# Patient Record
Sex: Male | Born: 1975 | Race: White | Hispanic: No | Marital: Married
Health system: Midwestern US, Community
[De-identification: ages and names within clinical notes are randomized; demographics above are authoritative.]

---

## 2000-04-30 HISTORY — PX: LUMBAR DISC SURGERY: SHX700

## 2001-04-30 HISTORY — PX: UVULOPALATOPHARYNGOPLASTY: SHX827

## 2008-11-24 ENCOUNTER — Ambulatory Visit: Payer: Self-pay | Admitting: Family Medicine

## 2008-11-24 ENCOUNTER — Telehealth (INDEPENDENT_AMBULATORY_CARE_PROVIDER_SITE_OTHER): Payer: Self-pay | Admitting: *Deleted

## 2008-11-24 DIAGNOSIS — A630 Anogenital (venereal) warts: Secondary | ICD-10-CM

## 2008-11-24 DIAGNOSIS — R51 Headache: Secondary | ICD-10-CM | POA: Insufficient documentation

## 2008-11-24 DIAGNOSIS — E1165 Type 2 diabetes mellitus with hyperglycemia: Secondary | ICD-10-CM

## 2008-11-24 DIAGNOSIS — R519 Headache, unspecified: Secondary | ICD-10-CM | POA: Insufficient documentation

## 2008-11-24 DIAGNOSIS — R5383 Other fatigue: Secondary | ICD-10-CM

## 2008-11-24 DIAGNOSIS — F329 Major depressive disorder, single episode, unspecified: Secondary | ICD-10-CM

## 2008-11-24 DIAGNOSIS — R5381 Other malaise: Secondary | ICD-10-CM | POA: Insufficient documentation

## 2008-11-25 LAB — CONVERTED CEMR LAB
ALT: 45 units/L (ref 0–53)
Basophils Absolute: 0 10*3/uL (ref 0.0–0.1)
Bilirubin, Direct: 0 mg/dL (ref 0.0–0.3)
Chloride: 107 meq/L (ref 96–112)
Eosinophils Relative: 4.6 % (ref 0.0–5.0)
GFR calc non Af Amer: 102.07 mL/min (ref >60)
Glucose, Bld: 131 mg/dL — ABNORMAL HIGH (ref 70–99)
HCT: 45.5 % (ref 39.0–52.0)
HDL: 35 mg/dL — ABNORMAL LOW (ref >39.00)
Hemoglobin: 15.3 g/dL (ref 13.0–17.0)
LDL Cholesterol: 70 mg/dL (ref 0–99)
Lymphs Abs: 2.2 10*3/uL (ref 0.7–4.0)
MCV: 87.1 fL (ref 78.0–100.0)
Monocytes Absolute: 0.4 10*3/uL (ref 0.1–1.0)
Monocytes Relative: 6 % (ref 3.0–12.0)
Neutro Abs: 3.9 10*3/uL (ref 1.4–7.7)
Potassium: 4.2 meq/L (ref 3.5–5.1)
RDW: 12.6 % (ref 11.5–14.6)
Sodium: 141 meq/L (ref 135–145)
TSH: 1.49 microintl units/mL (ref 0.35–5.50)
Total Bilirubin: 1.1 mg/dL (ref 0.3–1.2)
Total CHOL/HDL Ratio: 4
VLDL: 17.8 mg/dL (ref 0.0–40.0)

## 2008-11-26 ENCOUNTER — Ambulatory Visit: Payer: Self-pay | Admitting: Family Medicine

## 2010-05-05 ENCOUNTER — Encounter: Payer: Self-pay | Admitting: Family Medicine

## 2010-06-01 NOTE — Letter (Signed)
Summary: [facility]  [facility]   Imported By: Lennie Odor 05/16/2010 12:17:08  _____________________________________________________________________  External Attachment:    Type:   Image     Comment:   External Document

## 2010-10-13 ENCOUNTER — Ambulatory Visit (INDEPENDENT_AMBULATORY_CARE_PROVIDER_SITE_OTHER): Payer: 59 | Admitting: Family Medicine

## 2010-10-13 ENCOUNTER — Encounter: Payer: Self-pay | Admitting: Family Medicine

## 2010-10-13 DIAGNOSIS — Z0000 Encounter for general adult medical examination without abnormal findings: Secondary | ICD-10-CM

## 2010-10-13 DIAGNOSIS — E119 Type 2 diabetes mellitus without complications: Secondary | ICD-10-CM

## 2010-10-13 DIAGNOSIS — I878 Other specified disorders of veins: Secondary | ICD-10-CM

## 2010-10-13 DIAGNOSIS — I872 Venous insufficiency (chronic) (peripheral): Secondary | ICD-10-CM

## 2010-10-13 NOTE — Progress Notes (Signed)
  Subjective:    Patient ID: Maxwell Roberts, male    DOB: February 26, 1976, 35 y.o.   MRN: [id number]  HPI Patient seen for followup of chief complaint extremity swelling. History of morbid obesity. Type 2 diabetes poor. Poor followup. Previously on metformin. Had some fatigue and other atypical side effects. No GI side effects. Currently taking nothing for diabetes. Not monitoring blood sugars. No symptoms of hyperglycemia.  Patient expresses interest in possible gastric bypass  Edema relatively chronic lower legs but recently had some redness medial aspect of both legs. No itching. No pain. Denies fever or chills. No ulcerations. Progressive hair loss lower extremities over a few years. No claudication symptoms.  Generalized fatigue. No depressive symptoms. No physical in years   Review of Systems  Constitutional: Positive for fatigue.  Eyes: Negative for visual disturbance.  Respiratory: Negative for cough, shortness of breath and wheezing.   Cardiovascular: Positive for leg swelling. Negative for chest pain and palpitations.  Genitourinary: Negative for dysuria.  Neurological: Negative for dizziness and weakness.  Hematological: Negative for adenopathy.       Objective:   Physical Exam  Constitutional: He is oriented to person, place, and time.       Alert pleasant obese gentleman in no distress  HENT:  Head: Normocephalic and atraumatic.  Mouth/Throat: Oropharynx is clear and moist.  Neck: Neck supple. No thyromegaly present.  Cardiovascular: Normal rate and regular rhythm.   Pulmonary/Chest: Effort normal and breath sounds normal. No respiratory distress. He has no wheezes. He has no rales.  Musculoskeletal: He exhibits no tenderness.       Patient has some nonpitting edema lower legs bilaterally. No ulceration. Few prominent varicosities. Pigmentary change consistent with venous stasis  Lymphadenopathy:    He has no cervical adenopathy.  Neurological: He is alert and oriented to  person, place, and time.  Psychiatric: He has a normal mood and affect.          Assessment & Plan:  #1 morbid obesity. Patient inquiring about possible gastric bypass. Discussed that complete physical #2 type 2 diabetes. Poor history of compliance. Recheck A1c at physical within the next month. Consider getting back on metformin if tolerated. Discussed other options such as Victoza.  New glucose meter given and bring blood sugars at followup #3 venous stasis edema. Work on weight loss. Discussed compression hose if symptoms persist or worsen  #4  increased fatigue. Check labs with physical including testosterone level. Consider sleep study

## 2010-10-25 ENCOUNTER — Other Ambulatory Visit (INDEPENDENT_AMBULATORY_CARE_PROVIDER_SITE_OTHER): Payer: 59

## 2010-10-25 DIAGNOSIS — Z0000 Encounter for general adult medical examination without abnormal findings: Secondary | ICD-10-CM

## 2010-10-25 LAB — BASIC METABOLIC PANEL
Calcium: 8.9 mg/dL (ref 8.4–10.5)
GFR: 112.42 mL/min (ref >60.00)
Sodium: 140 mEq/L (ref 135–145)

## 2010-10-25 LAB — POCT URINALYSIS DIPSTICK
Ketones, UA: NEGATIVE
Spec Grav, UA: 1.025
Urobilinogen, UA: 0.2
pH, UA: 5.5

## 2010-10-25 LAB — LIPID PANEL
Cholesterol: 124 mg/dL (ref 0–200)
HDL: 36.1 mg/dL — ABNORMAL LOW (ref >39.00)
Triglycerides: 80 mg/dL (ref 0.0–149.0)

## 2010-10-25 LAB — HEPATIC FUNCTION PANEL
AST: 28 U/L (ref 0–37)
Albumin: 4 g/dL (ref 3.5–5.2)
Total Protein: 6.8 g/dL (ref 6.0–8.3)

## 2010-10-25 LAB — HEMOGLOBIN A1C: Hgb A1c MFr Bld: 9 % — ABNORMAL HIGH (ref 4.6–6.5)

## 2010-10-25 LAB — CBC WITH DIFFERENTIAL/PLATELET
Basophils Absolute: 0 10*3/uL (ref 0.0–0.1)
Eosinophils Relative: 0.8 % (ref 0.0–5.0)
HCT: 44.8 % (ref 39.0–52.0)
Hemoglobin: 15.3 g/dL (ref 13.0–17.0)
Lymphs Abs: 1.2 10*3/uL (ref 0.7–4.0)
MCV: 87.4 fl (ref 78.0–100.0)
Monocytes Absolute: 0.3 10*3/uL (ref 0.1–1.0)
Neutro Abs: 6.1 10*3/uL (ref 1.4–7.7)
Platelets: 237 10*3/uL (ref 150.0–400.0)
RDW: 13 % (ref 11.5–14.6)

## 2010-10-25 LAB — TSH: TSH: 1.51 u[IU]/mL (ref 0.35–5.50)

## 2010-10-30 ENCOUNTER — Other Ambulatory Visit: Payer: 59

## 2010-11-10 ENCOUNTER — Ambulatory Visit (INDEPENDENT_AMBULATORY_CARE_PROVIDER_SITE_OTHER): Payer: 59 | Admitting: Family Medicine

## 2010-11-10 ENCOUNTER — Encounter: Payer: Self-pay | Admitting: Family Medicine

## 2010-11-10 VITALS — BP 120/78 | HR 72 | Temp 98.7°F | Resp 12 | Ht 73.0 in | Wt 392.0 lb

## 2010-11-10 DIAGNOSIS — Z0000 Encounter for general adult medical examination without abnormal findings: Secondary | ICD-10-CM

## 2010-11-10 DIAGNOSIS — R5383 Other fatigue: Secondary | ICD-10-CM

## 2010-11-10 DIAGNOSIS — R5381 Other malaise: Secondary | ICD-10-CM

## 2010-11-10 DIAGNOSIS — E119 Type 2 diabetes mellitus without complications: Secondary | ICD-10-CM

## 2010-11-10 MED ORDER — METFORMIN HCL 500 MG PO TABS: 500.0000 mg | ORAL_TABLET | Freq: Two times a day (BID) | ORAL | Status: DC

## 2010-11-10 NOTE — Patient Instructions (Signed)
Follow up for testosterone level.

## 2010-11-10 NOTE — Progress Notes (Signed)
  Subjective:    Patient ID: Maxwell Roberts, male    DOB: [birth year], 35 y.o.   MRN: [id number]  HPI Patient here for complete physical examination. Has history of morbid obesity, type 2 diabetes currently not treated, and past history of depression. Tetanus given just yesterday.  Considering gastric bypass surgery. He's tried multiple weight loss programs without much success. He continues to have excessive fatigue as major complaint. He inquired about testosterone level but this was not drawn most recent labs. Past medical history, social history, and family history reviewed as recorded below  Past Medical History  Diagnosis Date  . Diabetes mellitus    Past Surgical History  Procedure Date  . Lumbar disc surgery 2002    L4-5  . Uvulopalatopharyngoplasty 2003    reports that he has never smoked. He has never used smokeless tobacco. He reports that he does not drink alcohol or use illicit drugs. family history includes Heart disease (age of onset:40) in his father. Allergies  Allergen Reactions  . Amoxicillin     REACTION: history childhood      Review of Systems  Constitutional: Positive for fatigue. Negative for fever, activity change, appetite change and unexpected weight change.  HENT: Negative for ear pain, congestion and trouble swallowing.   Eyes: Negative for pain and visual disturbance.  Respiratory: Negative for cough, shortness of breath and wheezing.   Cardiovascular: Negative for chest pain and palpitations.  Gastrointestinal: Negative for nausea, vomiting, abdominal pain, diarrhea, constipation, blood in stool, abdominal distention and rectal pain.  Genitourinary: Negative for dysuria, hematuria and testicular pain.  Musculoskeletal: Negative for joint swelling and arthralgias.  Skin: Negative for rash.  Neurological: Negative for dizziness, syncope and headaches.  Hematological: Negative for adenopathy.  Psychiatric/Behavioral: Negative for confusion and dysphoric  mood.       Objective:   Physical Exam  Constitutional: He is oriented to person, place, and time. He appears well-developed and well-nourished. No distress.  HENT:  Head: Normocephalic and atraumatic.  Right Ear: External ear normal.  Left Ear: External ear normal.  Mouth/Throat: Oropharynx is clear and moist.  Eyes: Conjunctivae and EOM are normal. Pupils are equal, round, and reactive to light.  Neck: Normal range of motion. Neck supple. No thyromegaly present.  Cardiovascular: Normal rate, regular rhythm and normal heart sounds.   No murmur heard. Pulmonary/Chest: No respiratory distress. He has no wheezes. He has no rales.  Abdominal: Soft. Bowel sounds are normal. He exhibits no distension and no mass. There is no tenderness. There is no rebound and no guarding.  Musculoskeletal: He exhibits no edema.  Lymphadenopathy:    He has no cervical adenopathy.  Neurological: He is alert and oriented to person, place, and time. He displays normal reflexes. No cranial nerve deficit.  Skin: No rash noted.  Psychiatric: He has a normal mood and affect. His behavior is normal.          Assessment & Plan:  Patient has morbid obesity and type 2 diabetes currently untreated. Recent A1c 9.0%. Continued fatigue which may be multifactorial. Add early-morning testosterone screen and he'll return for that. Start metformin 500 mg twice daily. Discussed weight loss and exercise. Routine followup 3 months.  He is considering looking into gastric bypass.

## 2011-02-06 ENCOUNTER — Other Ambulatory Visit: Payer: 59

## 2011-02-09 ENCOUNTER — Encounter: Payer: Self-pay | Admitting: Family Medicine

## 2011-02-09 ENCOUNTER — Ambulatory Visit (INDEPENDENT_AMBULATORY_CARE_PROVIDER_SITE_OTHER): Payer: 59 | Admitting: Family Medicine

## 2011-02-09 VITALS — BP 120/82 | Temp 97.8°F | Wt 383.0 lb

## 2011-02-09 DIAGNOSIS — E291 Testicular hypofunction: Secondary | ICD-10-CM

## 2011-02-09 DIAGNOSIS — E119 Type 2 diabetes mellitus without complications: Secondary | ICD-10-CM

## 2011-02-09 DIAGNOSIS — R5381 Other malaise: Secondary | ICD-10-CM

## 2011-02-09 DIAGNOSIS — I878 Other specified disorders of veins: Secondary | ICD-10-CM

## 2011-02-09 DIAGNOSIS — G4733 Obstructive sleep apnea (adult) (pediatric): Secondary | ICD-10-CM | POA: Insufficient documentation

## 2011-02-09 DIAGNOSIS — I872 Venous insufficiency (chronic) (peripheral): Secondary | ICD-10-CM

## 2011-02-09 DIAGNOSIS — R5383 Other fatigue: Secondary | ICD-10-CM

## 2011-02-09 MED ORDER — LANCET DEVICE MISC: Status: DC

## 2011-02-09 MED ORDER — METFORMIN HCL 500 MG PO TABS: 500.0000 mg | ORAL_TABLET | Freq: Two times a day (BID) | ORAL | Status: DC

## 2011-02-09 MED ORDER — GLUCOSE BLOOD VI STRP: ORAL_STRIP | Status: DC

## 2011-02-09 NOTE — Progress Notes (Signed)
  Subjective:    Patient ID: Maxwell Roberts, male    DOB: 1975-07-21, 34 y.o.   MRN: [id number]  HPI Medical followup. History of morbid obesity, type 2 diabetes, and fatigue. Recent A1c 9.0%. Has lost about 15 more pounds. Started metformin 500 mg twice daily. Blood sugar somewhat improved. Several fastings low 100s. Postprandials 150-160 range. Still has considerable fatigue. Remote history sleep apnea. Past surgical procedure several years ago (uvulopalatopharyngeoplasty). No obvious apnea this time.  Recent low total testosterone 178. He has decreased stamina. We discussed possible repeat level and he'll return next week for that along with repeat A1c. Currently takes only metformin. Compliant with therapy. No dyspnea. No chest pain.  Past Medical History  Diagnosis Date  . Diabetes mellitus    Past Surgical History  Procedure Date  . Lumbar disc surgery 2002    L4-5  . Uvulopalatopharyngoplasty 2003    reports that he has never smoked. He has never used smokeless tobacco. He reports that he does not drink alcohol or use illicit drugs. family history includes Heart disease (age of onset:40) in his father. Allergies  Allergen Reactions  . Amoxicillin     REACTION: history childhood      Review of Systems  Constitutional: Negative for fever, chills and unexpected weight change.  Respiratory: Negative for cough and shortness of breath.   Cardiovascular: Negative for chest pain.  Gastrointestinal: Negative for abdominal pain.  Genitourinary: Negative for dysuria.  Neurological: Negative for dizziness and headaches.       Objective:   Physical Exam  Constitutional: He is oriented to person, place, and time. He appears well-developed and well-nourished.  HENT:  Mouth/Throat: Oropharynx is clear and moist.  Neck: Neck supple.  Cardiovascular: Normal rate, regular rhythm and normal heart sounds.   No murmur heard. Pulmonary/Chest: Effort normal and breath sounds normal. No  respiratory distress. He has no wheezes. He has no rales.  Musculoskeletal:       Trace edema legs bilaterally. Hyperpigmentation changes lower legs consistent with venous stasis  Neurological: He is alert and oriented to person, place, and time.  Psychiatric: He has a normal mood and affect. His behavior is normal.          Assessment & Plan:  #1 type 2 diabetes. Poor control recently. Improving by home readings. Reassess A1c at followup lab next week. Continue weight loss efforts. #2 hypogonadism by recent screening testosterone. Return next week for repeat total and free testosterone level. We discussed various modes of replacement  #3 increased fatigue probably secondary to #2. #4 history of obstructive sleep apnea. No recent obvious apnea episodes. No significant daytime somnolence.

## 2011-02-13 ENCOUNTER — Other Ambulatory Visit (INDEPENDENT_AMBULATORY_CARE_PROVIDER_SITE_OTHER): Payer: 59

## 2011-02-13 DIAGNOSIS — E291 Testicular hypofunction: Secondary | ICD-10-CM

## 2011-02-13 DIAGNOSIS — R5383 Other fatigue: Secondary | ICD-10-CM

## 2011-02-13 LAB — HEMOGLOBIN A1C: Hgb A1c MFr Bld: 8.2 % — ABNORMAL HIGH (ref 4.6–6.5)

## 2011-02-14 LAB — TESTOSTERONE, FREE, TOTAL, SHBG
Sex Hormone Binding: 38 nmol/L (ref 13–71)
Testosterone, Free: 52.3 pg/mL (ref 47.0–244.0)
Testosterone-% Free: 1.8 % (ref 1.6–2.9)
Testosterone: 291.16 ng/dL (ref 250–890)

## 2011-02-15 ENCOUNTER — Other Ambulatory Visit: Payer: Self-pay | Admitting: Family Medicine

## 2011-02-15 MED ORDER — METFORMIN HCL 500 MG PO TABS: ORAL_TABLET | ORAL | Status: DC

## 2011-02-15 NOTE — Progress Notes (Signed)
Quick Note:  Pt informed on home VM ______ 

## 2011-05-11 ENCOUNTER — Ambulatory Visit: Payer: 59 | Admitting: Family Medicine

## 2011-05-17 ENCOUNTER — Ambulatory Visit (INDEPENDENT_AMBULATORY_CARE_PROVIDER_SITE_OTHER): Payer: 59 | Admitting: Family Medicine

## 2011-05-17 ENCOUNTER — Encounter: Payer: Self-pay | Admitting: Family Medicine

## 2011-05-17 VITALS — BP 120/84 | Temp 98.3°F | Wt 385.0 lb

## 2011-05-18 LAB — HEMOGLOBIN A1C: Hgb A1c MFr Bld: 9 % — ABNORMAL HIGH (ref 4.6–6.5)

## 2011-05-18 NOTE — Progress Notes (Signed)
  Subjective:    Patient ID: Maxwell Roberts, male    DOB: 1975/09/13, 36 y.o.   MRN: [id number]  HPI   Patient seen for followup type 2 diabetes. Last visit his A1c was up slightly and we titrated his metformin. Fasting blood sugars mostly around 130 to 140. No symptoms of hyperglycemia. Unfortunately, he has gained a few pounds over the holidays. He had orthopedic problem which has limited exercise. Poor compliance with diet at times. Compliant with all medications.   Review of Systems  Constitutional: Negative for fatigue.  Eyes: Negative for visual disturbance.  Respiratory: Negative for cough, chest tightness and shortness of breath.   Cardiovascular: Negative for chest pain, palpitations and leg swelling.  Neurological: Negative for dizziness, syncope, weakness, light-headedness and headaches.       Objective:   Physical Exam  Constitutional: He appears well-developed and well-nourished.  Cardiovascular: Normal rate and regular rhythm.   Pulmonary/Chest: Effort normal and breath sounds normal. No respiratory distress. He has no wheezes. He has no rales.  Musculoskeletal:       No pitting edema. No foot lesions. He has hyperpigmentation changes consistent with venous stasis. Normal sensory function to touch. No foot calluses or lesions  Neurological: He is alert.          Assessment & Plan:  Type 2 diabetes. History of suboptimal control. Recheck A1c. Continued work on weight loss. Patient considering gastric bypass. He plans to attend informational session on this next month

## 2011-05-22 ENCOUNTER — Other Ambulatory Visit: Payer: Self-pay | Admitting: Family Medicine

## 2011-05-22 NOTE — Progress Notes (Signed)
Quick Note:  Pt informed and voiced his understanding ______ 

## 2011-06-15 ENCOUNTER — Ambulatory Visit (INDEPENDENT_AMBULATORY_CARE_PROVIDER_SITE_OTHER): Payer: 59 | Admitting: Family Medicine

## 2011-06-15 ENCOUNTER — Encounter: Payer: Self-pay | Admitting: Family Medicine

## 2011-06-15 VITALS — BP 110/78 | Temp 98.5°F | Wt 390.0 lb

## 2011-06-15 DIAGNOSIS — B9789 Other viral agents as the cause of diseases classified elsewhere: Secondary | ICD-10-CM

## 2011-06-15 DIAGNOSIS — B349 Viral infection, unspecified: Secondary | ICD-10-CM

## 2011-06-15 MED ORDER — PREDNISONE 10 MG PO TABS: ORAL_TABLET | ORAL | Status: DC

## 2011-06-15 MED ORDER — ALBUTEROL SULFATE HFA 108 (90 BASE) MCG/ACT IN AERS: 2.0000 | INHALATION_SPRAY | Freq: Four times a day (QID) | RESPIRATORY_TRACT | Status: AC | PRN

## 2011-06-15 NOTE — Progress Notes (Signed)
  Subjective:    Patient ID: Maxwell Roberts, male    DOB: [birth year], 36 y.o.   MRN: [id number]  HPI  Patient is in with cough and congestion past few days. Onset Tuesday. On Wednesday had low-grade fever. He had asthma as a child. He feels he is wheezing some off and on. Mild dyspnea with activity. No chest pain. Cough mostly nonproductive. Mild intermittent headache. Some postnasal drip symptoms. Patient denies any nausea, vomiting, or diarrhea. Minimal sore throat. No significant body aches. Using ibuprofen for fever with good relief. Blood sugars stable   Review of Systems  Constitutional: Positive for fever and fatigue. Negative for appetite change.  HENT: Positive for sore throat and postnasal drip.   Respiratory: Positive for cough, shortness of breath and wheezing.   Cardiovascular: Negative for chest pain.       Objective:   Physical Exam  Constitutional: He appears well-developed and well-nourished.  HENT:  Right Ear: External ear normal.  Left Ear: External ear normal.  Mouth/Throat: Oropharynx is clear and moist.  Neck: Neck supple.  Cardiovascular: Normal rate and regular rhythm.   Pulmonary/Chest: Effort normal and breath sounds normal. No respiratory distress. He has no wheezes. He has no rales.  Lymphadenopathy:    He has no cervical adenopathy.          Assessment & Plan:  Viral syndrome. Possible reactive airway component though no wheezing noted at this time. Refill albuterol inhaler for as needed use. Wrote for prednisone taper to start if persistent wheezing and monitor blood sugars closely.

## 2011-06-15 NOTE — Patient Instructions (Signed)
Follow up for any persistent fever or worsening symptoms.

## 2011-06-20 ENCOUNTER — Telehealth: Payer: Self-pay | Admitting: Family Medicine

## 2011-06-20 MED ORDER — PREDNISONE 10 MG PO TABS: ORAL_TABLET | ORAL | Status: DC

## 2011-06-20 NOTE — Telephone Encounter (Signed)
Start back prednisone 20 mg 3-3-2-2-1-1 and follow up if no better after that.  Monitor blood sugars.

## 2011-06-20 NOTE — Telephone Encounter (Signed)
2/15 OV, prednisone taper and Albuterol.  Still really congested, coughing, sneezing, no fever, hard to breath.  Using inhaler, finished prednisone today.

## 2011-06-20 NOTE — Telephone Encounter (Signed)
Addended by: Melchor Amour on: 06/20/2011 02:20 PM   Modules accepted: Orders

## 2011-06-20 NOTE — Telephone Encounter (Signed)
Patient called stating that the rx prescribed at his last visit is not working. Please advise.

## 2011-12-30 ENCOUNTER — Emergency Department: Payer: Self-pay | Admitting: Internal Medicine

## 2011-12-30 LAB — CBC
HCT: 45.6 % (ref 40.0–52.0)
HGB: 15.4 g/dL (ref 13.0–18.0)
MCH: 28.9 pg (ref 26.0–34.0)
Platelet: 207 10*3/uL (ref 150–440)

## 2011-12-30 LAB — HEPATIC FUNCTION PANEL A (ARMC)
Albumin: 3.8 g/dL (ref 3.4–5.0)
Alkaline Phosphatase: 85 U/L (ref 50–136)
Bilirubin, Direct: 0.1 mg/dL (ref 0.00–0.20)
Bilirubin,Total: 0.5 mg/dL (ref 0.2–1.0)
Total Protein: 7.6 g/dL (ref 6.4–8.2)

## 2011-12-30 LAB — TROPONIN I: Troponin-I: <0.02 ng/mL

## 2011-12-30 LAB — BASIC METABOLIC PANEL
BUN: 12 mg/dL (ref 7–18)
Chloride: 101 mmol/L (ref 98–107)
Co2: 30 mmol/L (ref 21–32)
Creatinine: 0.96 mg/dL (ref 0.60–1.30)
EGFR (Non-African Amer.): >60
Potassium: 4 mmol/L (ref 3.5–5.1)

## 2011-12-30 LAB — CK TOTAL AND CKMB (NOT AT ARMC): CK-MB: 1.2 ng/mL (ref 0.5–3.6)

## 2011-12-30 LAB — LIPASE, BLOOD: Lipase: 78 U/L (ref 73–393)

## 2012-01-03 ENCOUNTER — Ambulatory Visit (INDEPENDENT_AMBULATORY_CARE_PROVIDER_SITE_OTHER): Payer: 59 | Admitting: Family Medicine

## 2012-01-03 ENCOUNTER — Encounter: Payer: Self-pay | Admitting: Family Medicine

## 2012-01-03 VITALS — BP 122/88 | Temp 97.8°F | Wt 376.0 lb

## 2012-01-03 DIAGNOSIS — IMO0001 Reserved for inherently not codable concepts without codable children: Secondary | ICD-10-CM

## 2012-01-03 DIAGNOSIS — R05 Cough: Secondary | ICD-10-CM

## 2012-01-03 DIAGNOSIS — R059 Cough, unspecified: Secondary | ICD-10-CM

## 2012-01-03 MED ORDER — GLIMEPIRIDE 4 MG PO TABS: 4.0000 mg | ORAL_TABLET | Freq: Every day | ORAL | Status: DC

## 2012-01-03 MED ORDER — HYDROCODONE-HOMATROPINE 5-1.5 MG/5ML PO SYRP: 5.0000 mL | ORAL_SOLUTION | Freq: Three times a day (TID) | ORAL | Status: AC | PRN

## 2012-01-03 NOTE — Progress Notes (Signed)
  Subjective:    Patient ID: Maxwell Roberts, male    DOB: Feb 09, 1976, 36 y.o.   MRN: [id number]  HPI  Patient seen for the following  Bronchitis symptoms. Onset about a week ago. Over the weekend went to [facility] with increased cough and fever 101. Reportedly had chest x-ray which apparently did not show any clear pneumonia the patient was started on Levaquin 750 mg daily. He has chest wall pain treated with ketorolac 10 mg which has been taking about twice daily and is helping musculoskeletal symptoms. Fevers resolved at this time. Questionable wheezing off and on at night. No nausea or vomiting. No pleuritic pain. Cough is mostly nonproductive. Worse at night. Interfering with sleep.  Type 2 diabetes. History of poor control. Last A1c 9.0%. Taking metformin thousand milligrams twice a day. Recent blood sugars 200-300 range.  Past Medical History  Diagnosis Date  . Diabetes mellitus    Past Surgical History  Procedure Date  . Lumbar disc surgery 2002    L4-5  . Uvulopalatopharyngoplasty 2003    reports that he has never smoked. He has never used smokeless tobacco. He reports that he does not drink alcohol or use illicit drugs. family history includes Heart disease (age of onset:40) in his father. Allergies  Allergen Reactions  . Amoxicillin     REACTION: history childhood      Review of Systems  Constitutional: Positive for fatigue. Negative for fever and chills.  Respiratory: Positive for cough and shortness of breath.   Cardiovascular: Negative for palpitations and leg swelling.  Gastrointestinal: Negative for abdominal pain.  Neurological: Negative for dizziness and syncope.  Hematological: Negative for adenopathy.       Objective:   Physical Exam  Constitutional: He appears well-developed and well-nourished.  HENT:  Right Ear: External ear normal.  Left Ear: External ear normal.  Mouth/Throat: Oropharynx is clear and moist.  Neck: Neck supple. No  thyromegaly present.  Cardiovascular: Normal rate and regular rhythm.   Pulmonary/Chest: Effort normal and breath sounds normal. No respiratory distress. He has no wheezes. He has no rales.  Musculoskeletal: He exhibits no edema.          Assessment & Plan:  #1 upper respiratory illness. Normal lung exam. Patient already on Levaquin. Hycodan cough syrup for nighttime use as needed. Followup promptly for any fever or worsening symptoms #2 type 2 diabetes with poor control. Add Amaryl 4 mg daily and continue metformin. Reassess 2 months and repeat A1c then

## 2012-01-03 NOTE — Patient Instructions (Signed)

## 2012-03-03 ENCOUNTER — Ambulatory Visit: Payer: 59 | Admitting: Family Medicine

## 2012-09-12 ENCOUNTER — Ambulatory Visit (INDEPENDENT_AMBULATORY_CARE_PROVIDER_SITE_OTHER): Payer: 59 | Admitting: Family Medicine

## 2012-09-12 ENCOUNTER — Encounter: Payer: Self-pay | Admitting: Family Medicine

## 2012-09-12 VITALS — BP 110/68 | Temp 97.8°F | Wt 373.0 lb

## 2012-09-12 DIAGNOSIS — N509 Disorder of male genital organs, unspecified: Secondary | ICD-10-CM

## 2012-09-12 DIAGNOSIS — N529 Male erectile dysfunction, unspecified: Secondary | ICD-10-CM

## 2012-09-12 DIAGNOSIS — N50811 Right testicular pain: Secondary | ICD-10-CM

## 2012-09-12 MED ORDER — LIRAGLUTIDE 18 MG/3ML ~~LOC~~ SOPN: 1.2000 [IU] | PEN_INJECTOR | Freq: Every day | SUBCUTANEOUS | Status: DC

## 2012-09-12 MED ORDER — SILDENAFIL CITRATE 100 MG PO TABS: 100.0000 mg | ORAL_TABLET | Freq: Every day | ORAL | Status: DC | PRN

## 2012-09-12 MED ORDER — METFORMIN HCL 500 MG PO TABS: 500.0000 mg | ORAL_TABLET | Freq: Two times a day (BID) | ORAL | Status: DC

## 2012-09-12 MED ORDER — INSULIN PEN NEEDLE 31G X 6 MM MISC: Status: DC

## 2012-09-12 NOTE — Progress Notes (Signed)
  Subjective:    Patient ID: Maxwell Roberts, male    DOB: June 19, 1975, 37 y.o.   MRN: [id number]  HPI Medical followup. Patient has several issues as follows History of recent poor compliance. Ran out of medication about 2 months ago. Fasting blood sugar this morning 148. Previously took metformin and Amaryl. Last A1c was over one year ago and 9.0%. No recent eye exam. No consistent exercise. Patient is very busy with work and school Usually only gets about 4-6 hours of sleep per night.  Recent problems with erectile dysfunction. No history of peripheral vascular disease. Good libido.  Right testicular pain for 3 months. No masses noted. No dysuria  Past Medical History  Diagnosis Date  . Diabetes mellitus    Past Surgical History  Procedure Laterality Date  . Lumbar disc surgery  2002    L4-5  . Uvulopalatopharyngoplasty  2003    reports that he has never smoked. He has never used smokeless tobacco. He reports that he does not drink alcohol or use illicit drugs. family history includes Heart disease (age of onset: 48) in his father. Allergies  Allergen Reactions  . Amoxicillin     REACTION: history childhood      Review of Systems  Constitutional: Negative for fatigue and unexpected weight change.  Eyes: Negative for visual disturbance.  Respiratory: Negative for cough, chest tightness and shortness of breath.   Cardiovascular: Negative for chest pain, palpitations and leg swelling.  Genitourinary: Positive for testicular pain. Negative for dysuria and hematuria.  Neurological: Negative for dizziness, syncope, weakness, light-headedness and headaches.  Hematological: Negative for adenopathy.       Objective:   Physical Exam  Constitutional: He appears well-developed and well-nourished.  Neck: Neck supple.  Cardiovascular: Normal rate and regular rhythm.  Exam reveals no gallop.   Pulmonary/Chest: Effort normal and breath sounds normal. No respiratory distress. He has no  wheezes. He has no rales.  Genitourinary:  He has some nonspecific generalized right testicle tenderness. No distinct mass noted. No obvious hernia  Musculoskeletal: He exhibits no edema.          Assessment & Plan:  #1 type 2 diabetes. History of poor control. History of poor compliance. We discussed starting back on metformin 500 mg twice a day. He had intolerance with diarrhea and nausea with higher doses. Discontinue Amaryl with patient struggling with weight loss. Start Victoza 0.6 mg once daily for one week then titrate to 1.2 mg daily.  Needs to set up eye exam. Check baseline A1c and urine microalbumin today #2 erectile dysfunction. Trial of Viagra 100 mg one half to one tablet daily as needed #3 right testicle pain. Set up ultrasound to further evaluate. No worrisome masses noted and no hernia.

## 2012-09-12 NOTE — Patient Instructions (Signed)
Victoza 0.6 mg once daily for one week then increase to 1.2 mg once daily Set up eye exam

## 2012-09-13 LAB — MICROALBUMIN / CREATININE URINE RATIO: Microalb Creat Ratio: 2.6 mg/g (ref 0.0–30.0)

## 2012-09-15 ENCOUNTER — Other Ambulatory Visit: Payer: Self-pay | Admitting: *Deleted

## 2012-09-15 DIAGNOSIS — N5082 Scrotal pain: Secondary | ICD-10-CM

## 2012-09-16 NOTE — Progress Notes (Signed)
Quick Note:  Pt informed on VM ______ 

## 2012-09-24 ENCOUNTER — Encounter: Payer: Self-pay | Admitting: Family Medicine

## 2012-09-24 ENCOUNTER — Ambulatory Visit (INDEPENDENT_AMBULATORY_CARE_PROVIDER_SITE_OTHER): Payer: 59 | Admitting: Family Medicine

## 2012-09-24 VITALS — BP 110/78 | Temp 99.4°F | Wt 373.0 lb

## 2012-09-24 DIAGNOSIS — L559 Sunburn, unspecified: Secondary | ICD-10-CM

## 2012-09-24 MED ORDER — HYDROCODONE-ACETAMINOPHEN 5-325 MG PO TABS: ORAL_TABLET | ORAL | Status: DC

## 2012-09-24 NOTE — Patient Instructions (Addendum)
Sunburn  Sunburn is damage to the skin caused by overexposure to ultraviolet (UV) rays. People with light skin or a fair complexion may be more susceptible to sunburn. Repeated sun exposure causes early skin aging such as wrinkles and sun spots. It also increases the risk of skin cancer.  CAUSES  A sunburn is caused by getting too much UV radiation from the sun.  SYMPTOMS   Red or pink skin.   Soreness and swelling.   Pain.   Blisters.   Peeling skin.   Headache, fever, and fatigue if sunburn covers a large area.  TREATMENT   Your caregiver may tell you to take certain medicines to lessen inflammation.   Your caregiver may have you use hydrocortisone cream or spray to help with itching and inflammation.   Your caregiver may prescribe an antibiotic cream to use on blisters.  HOME CARE INSTRUCTIONS    Avoid further exposure to the sun.   Cool baths and cool compresses may be helpful if used several times per day. Do not apply ice, since this may result in more damage to the skin.   Only take over-the-counter or prescription medicines for pain, discomfort, or fever as directed by your caregiver.   Use aloe or other over-the-counter sunburn creams or gels on your skin. Do not apply these creams or gels on blisters.   Drink enough fluids to keep your urine clear or pale yellow.   Do not break blisters. If blisters break, your caregiver may recommend an antibiotic cream to apply to the affected area.  PREVENTION    Try to avoid the sun between 10:00 a.m. and 4:00 p.m. when it is the strongest.   Use a sunscreen or sunblock with SPF 30 or greater.   Apply sunscreen at least 30 minutes before exposure to the sun.   Always wear protective hats, clothing, and sunglasses with UV protection.   Avoid medicines, herbs, and foods that increase your sensitivity to sunlight.   Avoid tanning beds.  SEEK IMMEDIATE MEDICAL CARE IF:    You have a fever.   Your pain is uncontrolled with medicine.   You start to  vomit or have diarrhea.   You feel faint or develop a headache with confusion.   You develop severe blistering.   You have a pus-like (purulent) discharge coming from the blisters.   Your burn becomes more painful and swollen.  MAKE SURE YOU:   Understand these instructions.   Will watch your condition.   Will get help right away if you are not doing well or get worse.  Document Released: 01/24/2005 Document Revised: 07/09/2011 Document Reviewed: 10/08/2010  [employer] Patient Information 2014 [employer]

## 2012-09-24 NOTE — Progress Notes (Signed)
  Subjective:    Patient ID: Maxwell Roberts, male    DOB: 24-Sep-1975, 37 y.o.   MRN: [id number]  HPI Acute visit. Patient seen with severe sunburn. This occurred over the weekend on Saturday. He initially had some possible low-grade fever, chills, lethargy and basically evidence for sun poisoning Overall improving but still has severe pain with sunburn. He's tried topical aloe and some basic over-the-counter analgesics without much improvement No further fever  Type 2 diabetes with improvement with addition recently of Victoza. Fasting blood sugars recently around 130 which is an improvement for him  Past Medical History  Diagnosis Date  . Diabetes mellitus    Past Surgical History  Procedure Laterality Date  . Lumbar disc surgery  2002    L4-5  . Uvulopalatopharyngoplasty  2003    reports that he has never smoked. He has never used smokeless tobacco. He reports that he does not drink alcohol or use illicit drugs. family history includes Heart disease (age of onset: 12) in his father. Allergies  Allergen Reactions  . Amoxicillin     REACTION: history childhood      Review of Systems  Constitutional: Negative for fever and chills.       Objective:   Physical Exam  Constitutional: He appears well-developed and well-nourished.  Cardiovascular: Normal rate and regular rhythm.   Pulmonary/Chest: Effort normal and breath sounds normal. No respiratory distress. He has no wheezes. He has no rales.  Skin:  Patient has fairly severe sunburn involving upper back, neck, shoulders, and upper extremities and upper anterior chest. He has some early blistering involving the shoulders and upper back region.          Assessment & Plan:  Severe sunburn. Avoid further sun exposure. Sun block in the future. Continue aloe. Limited hydrocodone 5/325 mg #30 tablets one to 2 every 6 hours for severe pain. He will use mostly at night for sleep issues. Keep clean with soap and water and avoid  secondary infection

## 2012-10-10 ENCOUNTER — Other Ambulatory Visit: Payer: 59

## 2012-10-10 ENCOUNTER — Ambulatory Visit: Payer: 59 | Admitting: Family Medicine

## 2012-10-13 ENCOUNTER — Encounter: Payer: Self-pay | Admitting: Family Medicine

## 2012-10-13 ENCOUNTER — Ambulatory Visit (INDEPENDENT_AMBULATORY_CARE_PROVIDER_SITE_OTHER): Payer: 59 | Admitting: Family Medicine

## 2012-10-13 ENCOUNTER — Ambulatory Visit
Admission: RE | Admit: 2012-10-13 | Discharge: 2012-10-13 | Disposition: A | Discharge status: Home / Self Care | Payer: 59 | Source: Ambulatory Visit | Attending: Family Medicine | Admitting: Family Medicine

## 2012-10-13 VITALS — BP 120/84 | Temp 99.4°F | Wt 379.0 lb

## 2012-10-13 DIAGNOSIS — N5082 Scrotal pain: Secondary | ICD-10-CM

## 2012-10-13 DIAGNOSIS — N50811 Right testicular pain: Secondary | ICD-10-CM

## 2012-10-13 NOTE — Progress Notes (Signed)
  Subjective:    Patient ID: Maxwell Roberts, male    DOB: 1975/12/27, 37 y.o.   MRN: [id number]  HPI Followup type 2 diabetes. Recent addition of Victoza 1.2 once daily and tolerating well Also remains on metformin 500 mg twice a day. Last A1c 9.3%. Symptomatically improved with addition of medication above. Denies any thirst. No significant urine frequency. No blurred vision. Plan to start more consistent exercise soon. Thus far, has not seen much weight loss  Past Medical History  Diagnosis Date  . Diabetes mellitus    Past Surgical History  Procedure Laterality Date  . Lumbar disc surgery  2002    L4-5  . Uvulopalatopharyngoplasty  2003    reports that he has never smoked. He has never used smokeless tobacco. He reports that he does not drink alcohol or use illicit drugs. family history includes Heart disease (age of onset: 9) in his father. Allergies  Allergen Reactions  . Amoxicillin     REACTION: history childhood      Review of Systems  Constitutional: Negative for fatigue.  Eyes: Negative for visual disturbance.  Respiratory: Negative for cough, chest tightness and shortness of breath.   Cardiovascular: Negative for chest pain, palpitations and leg swelling.  Endocrine: Negative for polydipsia, polyphagia and polyuria.  Neurological: Negative for dizziness, syncope, weakness, light-headedness and headaches.       Objective:   Physical Exam  Constitutional: He appears well-developed and well-nourished.  Cardiovascular: Normal rate and regular rhythm.   Pulmonary/Chest: Effort normal and breath sounds normal. No respiratory distress. He has no wheezes. He has no rales.  Musculoskeletal: He exhibits no edema.          Assessment & Plan:  Type 2 diabetes. Improved by home readings. Titrate metformin to 500 mg 2 tablets twice daily. Continue Victoza 1.2 mg once daily. Future A1c ordered for 2 months.  Continue weight loss efforts

## 2012-10-13 NOTE — Progress Notes (Signed)
Quick Note:  LMTCB for [place] report ______

## 2012-10-15 NOTE — Progress Notes (Signed)
Quick Note:  LMTCB ______ 

## 2012-10-15 NOTE — Progress Notes (Signed)
Quick Note:  Pt informed ______ 

## 2012-12-15 ENCOUNTER — Other Ambulatory Visit: Payer: 59

## 2012-12-16 ENCOUNTER — Other Ambulatory Visit (INDEPENDENT_AMBULATORY_CARE_PROVIDER_SITE_OTHER): Payer: 59

## 2012-12-16 DIAGNOSIS — IMO0001 Reserved for inherently not codable concepts without codable children: Secondary | ICD-10-CM

## 2012-12-22 ENCOUNTER — Telehealth: Payer: Self-pay

## 2012-12-22 ENCOUNTER — Ambulatory Visit: Payer: 59 | Admitting: Family Medicine

## 2012-12-22 NOTE — Telephone Encounter (Signed)
Left message on pt VM that he missed his appointment

## 2013-04-06 ENCOUNTER — Ambulatory Visit: Payer: 59 | Admitting: Family Medicine

## 2013-04-13 ENCOUNTER — Encounter: Payer: Self-pay | Admitting: Family Medicine

## 2013-04-13 ENCOUNTER — Ambulatory Visit (INDEPENDENT_AMBULATORY_CARE_PROVIDER_SITE_OTHER): Payer: 59 | Admitting: Family Medicine

## 2013-04-13 VITALS — BP 124/84 | HR 74 | Temp 97.2°F | Wt 383.6 lb

## 2013-04-13 DIAGNOSIS — Z23 Encounter for immunization: Secondary | ICD-10-CM

## 2013-04-13 LAB — HM DIABETES EYE EXAM

## 2013-04-13 NOTE — Progress Notes (Signed)
   Subjective:    Patient ID: Maxwell Roberts, male    DOB: 06-22-1975, 37 y.o.   MRN: [id number]  HPI Patient here for medical followup. He has type 2 diabetes and history of poor compliance with diet and exercise. His weight is unfortunately up from last visit. He remains on metformin total of 2000 mg daily and Victoza 1.2 mg once daily. He does not consistently take this medication. Not checking blood sugars regularly. Occasional fastings over 200. Last A1c 7.9%. He has not had lipid panel in 2 years.  He had some recent increased thirst but no polyuria. He has been finishing up college and has had increased stress related to that. Poor dietary compliance.  Past Medical History  Diagnosis Date  . Diabetes mellitus    Past Surgical History  Procedure Laterality Date  . Lumbar disc surgery  2002    L4-5  . Uvulopalatopharyngoplasty  2003    reports that he has never smoked. He has never used smokeless tobacco. He reports that he does not drink alcohol or use illicit drugs. family history includes Heart disease (age of onset: 73) in his father. Allergies  Allergen Reactions  . Amoxicillin     REACTION: history childhood      Review of Systems  Constitutional: Positive for fatigue. Negative for unexpected weight change.  Eyes: Negative for visual disturbance.  Respiratory: Negative for cough, chest tightness and shortness of breath.   Cardiovascular: Negative for chest pain, palpitations and leg swelling.  Endocrine: Positive for polydipsia. Negative for polyuria.  Neurological: Negative for dizziness, syncope, weakness, light-headedness and headaches.       Objective:   Physical Exam  Constitutional: He appears well-developed and well-nourished.  Neck: Neck supple. No thyromegaly present.  Cardiovascular: Normal rate and regular rhythm.   Pulmonary/Chest: Effort normal and breath sounds normal. No respiratory distress. He has no wheezes. He has no rales.  Musculoskeletal:  He exhibits no edema.          Assessment & Plan:  Type 2 diabetes. History of suboptimal control. Poor compliance. Repeat A1c and also added additional labs of lipid panel, basic metabolic panel, hepatic panel. He'll return fasting for these tomorrow. If blood sugar still elevated, consider SGPL 2 inhibitor.  Long discussion with patient regarding need for better dietary compliance and weight loss. He is encouraged to set up eye exam

## 2013-04-13 NOTE — Progress Notes (Signed)
Pre visit review using our clinic review tool, if applicable. No additional management support is needed unless otherwise documented below in the visit note. 

## 2013-04-14 ENCOUNTER — Other Ambulatory Visit (INDEPENDENT_AMBULATORY_CARE_PROVIDER_SITE_OTHER): Payer: 59

## 2013-04-14 LAB — HEMOGLOBIN A1C: Hgb A1c MFr Bld: 11.5 % — ABNORMAL HIGH (ref 4.6–6.5)

## 2013-04-14 LAB — BASIC METABOLIC PANEL
BUN: 13 mg/dL (ref 6–23)
Chloride: 100 mEq/L (ref 96–112)
Potassium: 4.3 mEq/L (ref 3.5–5.1)
Sodium: 134 mEq/L — ABNORMAL LOW (ref 135–145)

## 2013-04-14 LAB — LIPID PANEL
Cholesterol: 113 mg/dL (ref 0–200)
LDL Cholesterol: 56 mg/dL (ref 0–99)

## 2013-04-14 LAB — HEPATIC FUNCTION PANEL
AST: 30 U/L (ref 0–37)
Albumin: 4 g/dL (ref 3.5–5.2)
Alkaline Phosphatase: 68 U/L (ref 39–117)
Bilirubin, Direct: 0.2 mg/dL (ref 0.0–0.3)

## 2013-04-15 ENCOUNTER — Other Ambulatory Visit: Payer: Self-pay | Admitting: Family Medicine

## 2013-04-15 ENCOUNTER — Other Ambulatory Visit: Payer: Self-pay

## 2013-04-15 MED ORDER — CANAGLIFLOZIN 100 MG PO TABS: 100.0000 mg | ORAL_TABLET | Freq: Every day | ORAL | Status: DC

## 2013-07-13 ENCOUNTER — Ambulatory Visit: Payer: 59 | Admitting: Family Medicine

## 2013-07-19 IMAGING — CR DG CHEST 2V
1 series · 3 of 3 positions shown · non-contrast
Comparison: none

REASON FOR EXAM: cough, chest pain
COMMENTS:

PROCEDURE:     DXR - DXR CHEST PA (OR AP) AND LATERAL  - December 30, 2011 [DATE]
RESULT:     The lungs are clear. The cardiac silhouette and visualized bony
skeleton are unremarkable.

[Series 1: w chest pa · 0.14mm/px · 3 of 3 slices shown]
[im 1/3]
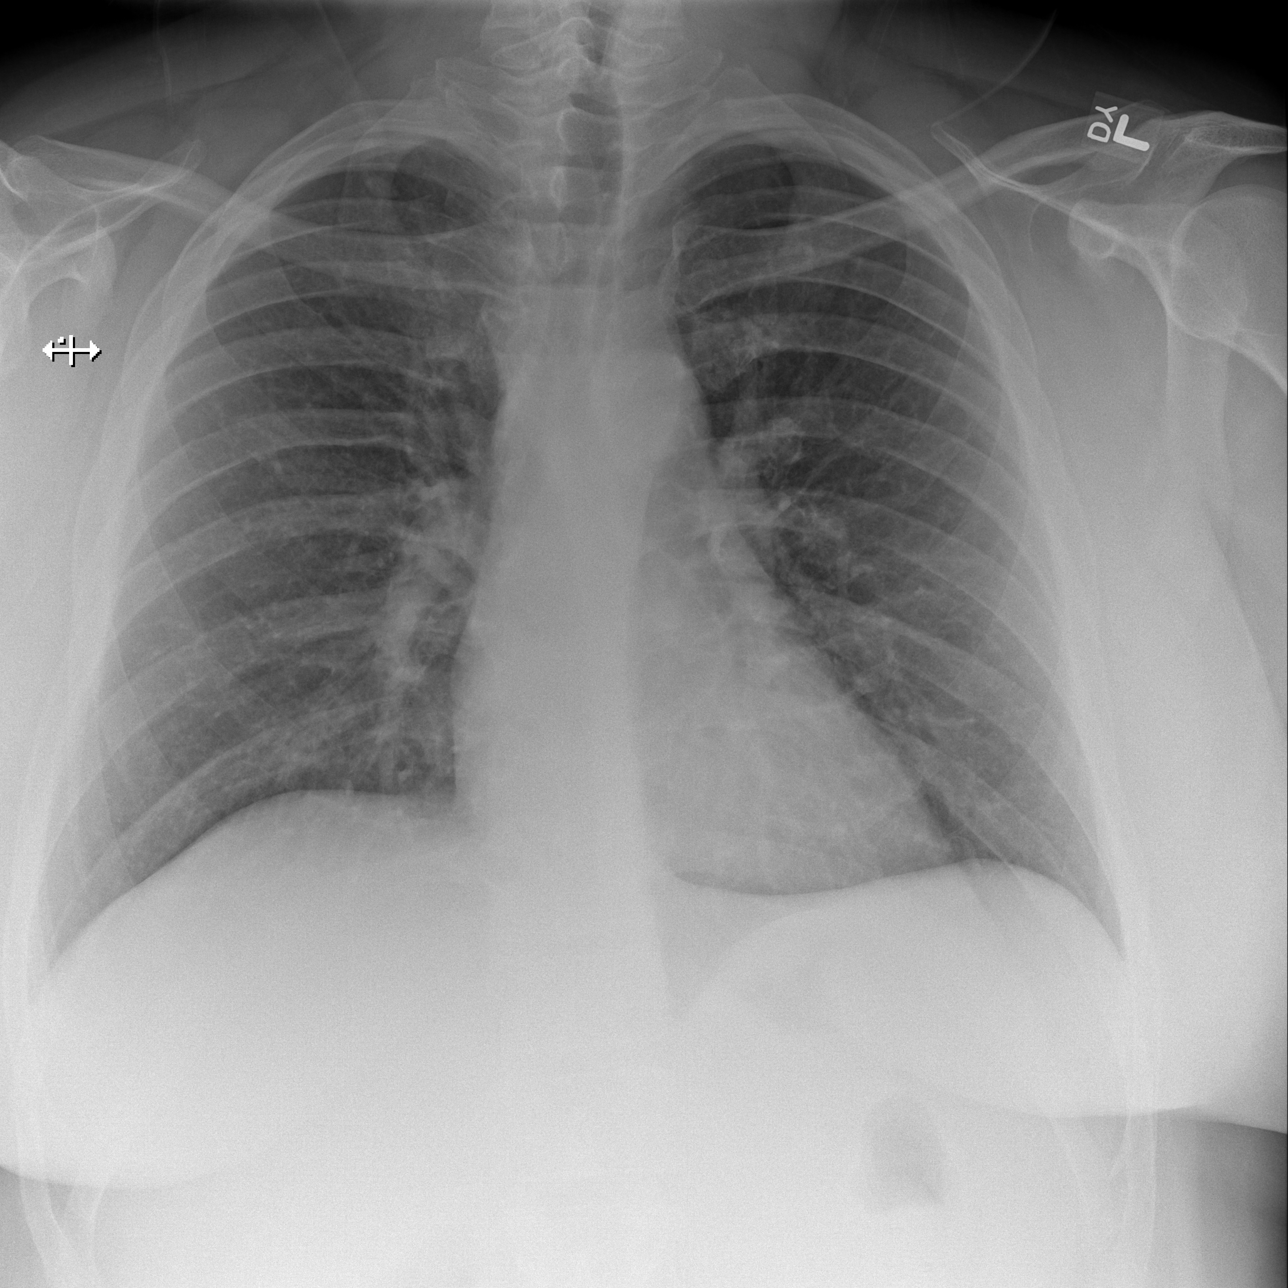
[im 2/3]
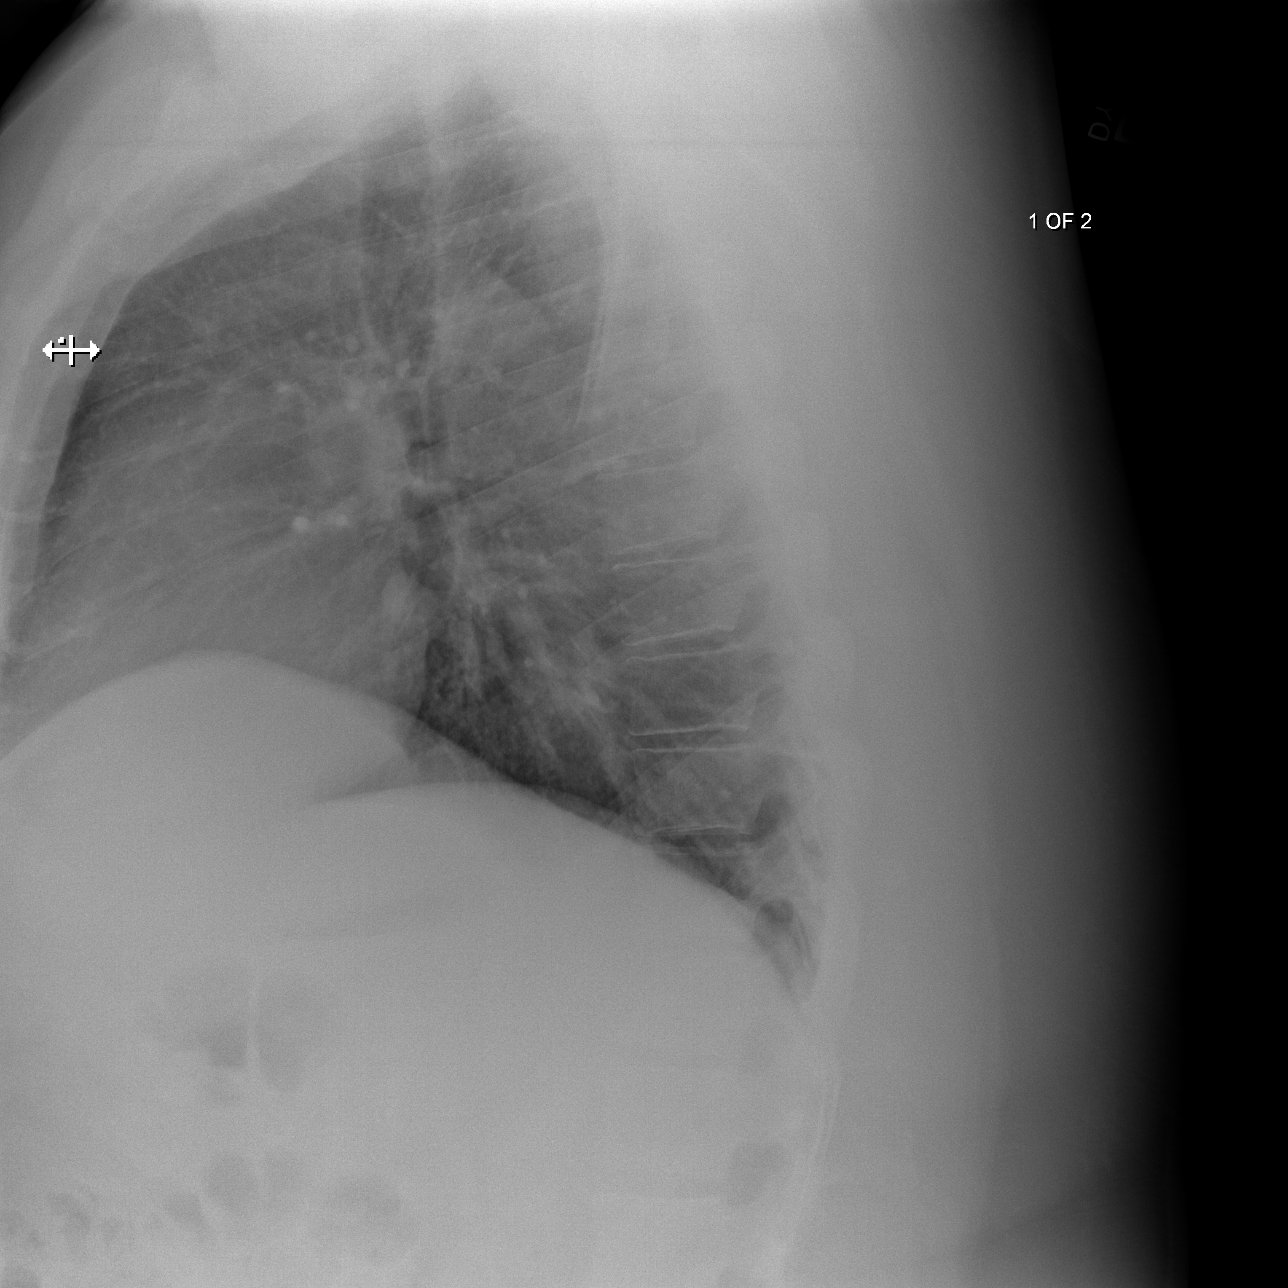
[im 3/3]
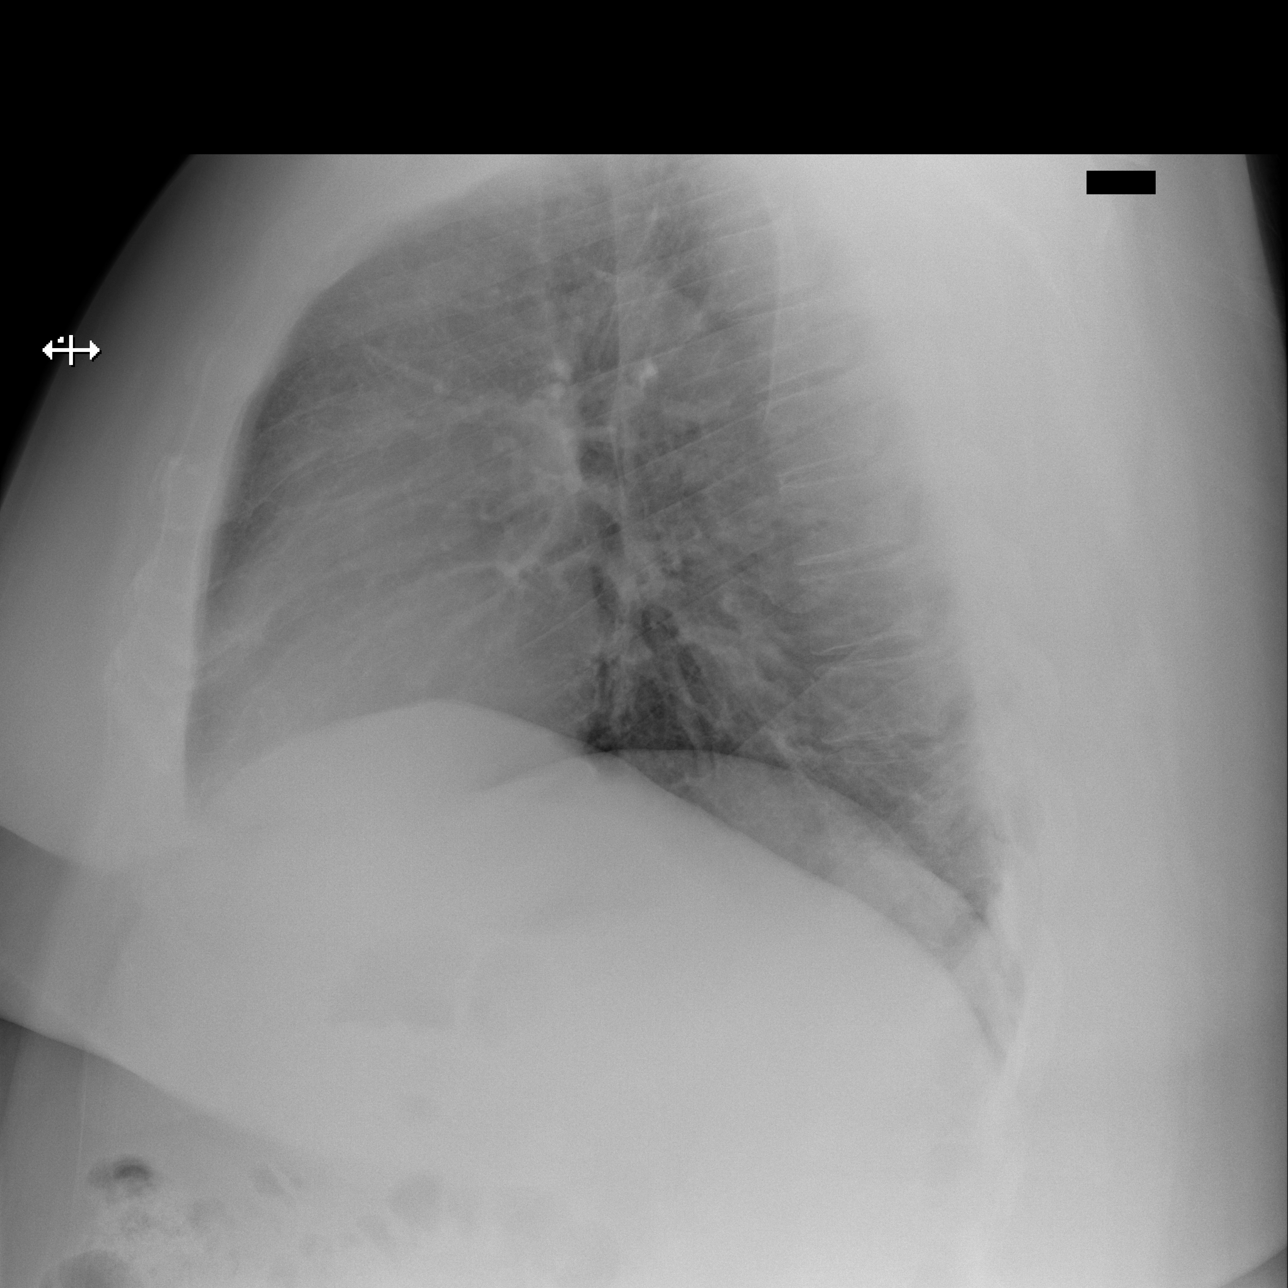

[3 of 3 positions shown; findings below may reference images not displayed]

IMPRESSION: 1. Chest radiograph without evidence of acute cardiopulmonary disease.

## 2013-10-05 ENCOUNTER — Other Ambulatory Visit: Payer: Self-pay | Admitting: Family Medicine

## 2013-11-18 ENCOUNTER — Telehealth: Payer: Self-pay | Admitting: *Deleted

## 2013-11-18 DIAGNOSIS — E109 Type 1 diabetes mellitus without complications: Secondary | ICD-10-CM

## 2013-11-18 DIAGNOSIS — E1165 Type 2 diabetes mellitus with hyperglycemia: Secondary | ICD-10-CM

## 2013-11-18 DIAGNOSIS — IMO0001 Reserved for inherently not codable concepts without codable children: Secondary | ICD-10-CM

## 2013-11-18 NOTE — Telephone Encounter (Signed)
Left message on machine for patient to schedule an office/lab appointment to follow up with diabetes A1c, bmet, micro albumin ordered Diabetic bundle

## 2014-01-13 ENCOUNTER — Encounter: Payer: Self-pay | Admitting: Family Medicine

## 2014-01-13 ENCOUNTER — Other Ambulatory Visit: Payer: Self-pay

## 2014-01-13 ENCOUNTER — Ambulatory Visit (INDEPENDENT_AMBULATORY_CARE_PROVIDER_SITE_OTHER): Payer: BC Managed Care – PPO | Admitting: Family Medicine

## 2014-01-13 VITALS — BP 124/80 | HR 93 | Temp 97.5°F | Wt 376.0 lb

## 2014-01-13 DIAGNOSIS — IMO0001 Reserved for inherently not codable concepts without codable children: Secondary | ICD-10-CM

## 2014-01-13 DIAGNOSIS — E1165 Type 2 diabetes mellitus with hyperglycemia: Principal | ICD-10-CM

## 2014-01-13 DIAGNOSIS — B3742 Candidal balanitis: Secondary | ICD-10-CM

## 2014-01-13 DIAGNOSIS — B3749 Other urogenital candidiasis: Secondary | ICD-10-CM

## 2014-01-13 DIAGNOSIS — R05 Cough: Secondary | ICD-10-CM

## 2014-01-13 DIAGNOSIS — R059 Cough, unspecified: Secondary | ICD-10-CM

## 2014-01-13 MED ORDER — LANCET DEVICE MISC: Status: DC

## 2014-01-13 MED ORDER — METFORMIN HCL 1000 MG PO TABS: 1000.0000 mg | ORAL_TABLET | Freq: Two times a day (BID) | ORAL | Status: DC

## 2014-01-13 MED ORDER — AZITHROMYCIN 250 MG PO TABS
ORAL_TABLET | ORAL | Status: AC
Start: 2014-01-13 — End: 2014-01-18

## 2014-01-13 MED ORDER — FLUCONAZOLE 100 MG PO TABS: 100.0000 mg | ORAL_TABLET | Freq: Every day | ORAL | Status: DC

## 2014-01-13 MED ORDER — CANAGLIFLOZIN 100 MG PO TABS: 100.0000 mg | ORAL_TABLET | Freq: Every day | ORAL | Status: DC

## 2014-01-13 MED ORDER — LIRAGLUTIDE 18 MG/3ML ~~LOC~~ SOPN: 1.2000 mg | PEN_INJECTOR | Freq: Every day | SUBCUTANEOUS | Status: DC

## 2014-01-13 NOTE — Patient Instructions (Signed)

## 2014-01-13 NOTE — Progress Notes (Signed)
Pre visit review using our clinic review tool, if applicable. No additional management support is needed unless otherwise documented below in the visit note. 

## 2014-01-13 NOTE — Progress Notes (Signed)
   Subjective:    Patient ID: Maxwell Roberts, male    DOB: 12/12/1975, 38 y.o.   MRN: [id number]  Cough Associated symptoms include headaches and a rash. Pertinent negatives include no chest pain, chills, fever or shortness of breath.   Patient seen for several items as follows  Acute issue of 2 week history of cough. He's had some headache and occasional night sweats. He was concerned about possible "walking pneumonia".  Nonsmoker. Cough mostly nonproductive. Possibly some mild wheezing off and on. No history of asthma. Increased fatigue. No appetite or weight changes.  Type 2 diabetes. He had lack of insurance coverage until recently. Has been out of medications for a couple months. Needs to get all refills. Not monitoring blood sugars regularly.  Penile rash. Whitish discharge and itching along the glans. He's tried over-the-counter antifungal such as Lamisil without improvement. No dysuria  Past Medical History  Diagnosis Date  . Diabetes mellitus    Past Surgical History  Procedure Laterality Date  . Lumbar disc surgery  2002    L4-5  . Uvulopalatopharyngoplasty  2003    reports that he has never smoked. He has never used smokeless tobacco. He reports that he does not drink alcohol or use illicit drugs. family history includes Heart disease (age of onset: 66) in his father. Allergies  Allergen Reactions  . Amoxicillin     REACTION: history childhood      Review of Systems  Constitutional: Positive for fatigue. Negative for fever, chills, appetite change and unexpected weight change.  HENT: Positive for sneezing.   Respiratory: Positive for cough. Negative for shortness of breath.   Cardiovascular: Negative for chest pain.  Skin: Positive for rash.  Neurological: Positive for headaches.       Objective:   Physical Exam  Constitutional: He appears well-developed and well-nourished.  HENT:  Right Ear: External ear normal.  Left Ear: External ear normal.  Mouth/Throat:  Oropharynx is clear and moist.  Neck: Neck supple.  Cardiovascular: Normal rate and regular rhythm.  Exam reveals no gallop.   No murmur heard. Pulmonary/Chest: Effort normal and breath sounds normal. No respiratory distress. He has no wheezes. He has no rales.  Skin:  Patient has some mild erythema involving the glans of the penis and some minimal whitish discharge. Minimal erythema of the foreskin of the penis          Assessment & Plan:  #1 type 2 diabetes. Has been out of medications as above. Start back metformin at 1000 mg daily for one week then titrate to thousand milligrams twice a day. Start back Victoza 0.6 mg once daily for one week then titrate to 1.2 mg once daily. Start back Invokana 100 mg daily. Followup in 3 months and recheck A1c then #2 yeast balanitis. Keep area dry as possible. Has already tried topical antifungals without improvement. Fluconazole 100 mg daily for 7 days./ risk factor of diabetes. #3 cough. Probable acute bronchitis. No reactive airway findings today. Given duration, start Zithromax. Touch base if cough not improved over the next couple of weeks.

## 2014-04-14 ENCOUNTER — Ambulatory Visit: Payer: BC Managed Care – PPO | Admitting: Family Medicine

## 2014-05-03 IMAGING — US US SCROTUM
1 series · 14 of 25 positions shown · non-contrast
Comparison: None

CLINICAL DATA: Right testicle pain.

SCROTAL ULTRASOUND
DOPPLER ULTRASOUND OF THE TESTICLES
TECHNIQUE: Complete ultrasound examination of the testicles,
epididymis, and other scrotal structures was performed.  Color and
spectral Doppler ultrasound were also utilized to evaluate blood
flow to the testicles.

[Series 1: us scrotum · 0.07mm/px · 14 of 67 slices shown]
[im 1/67]
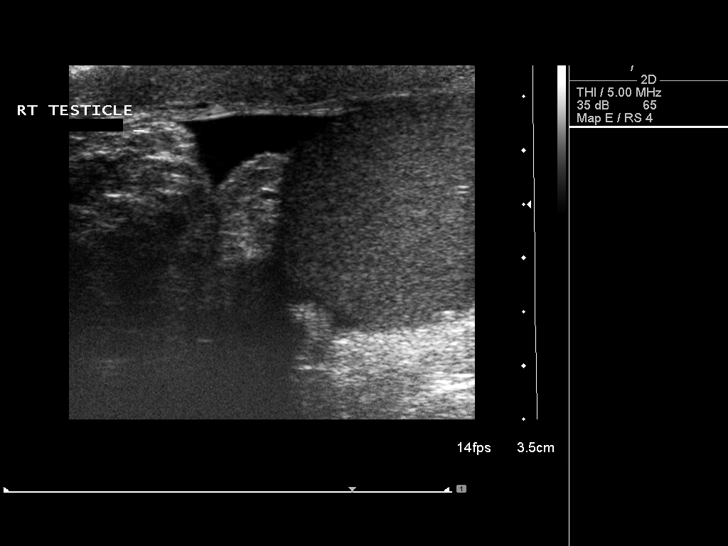
[im 6/67]
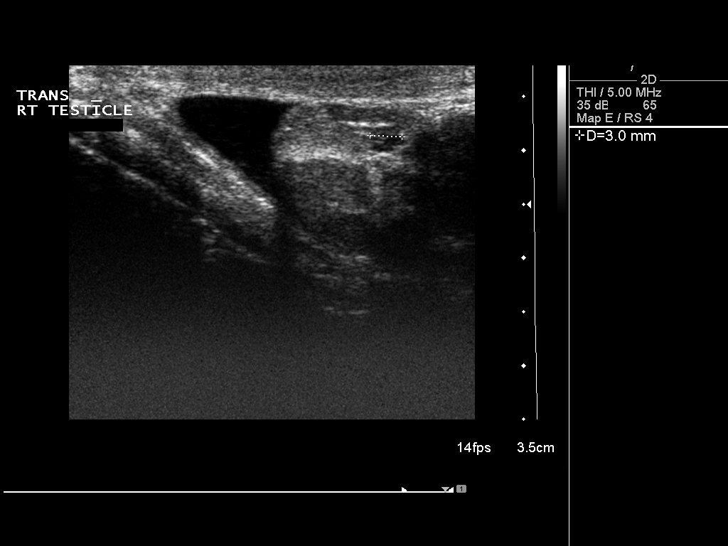
[im 12/67]
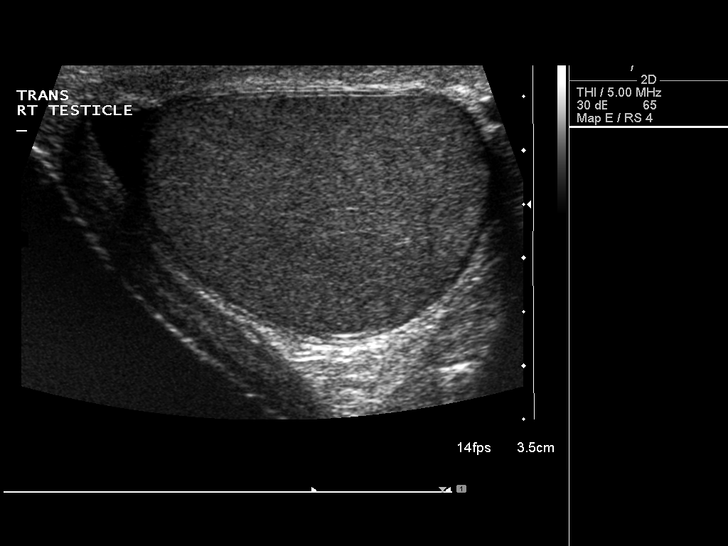
[im 17/67]
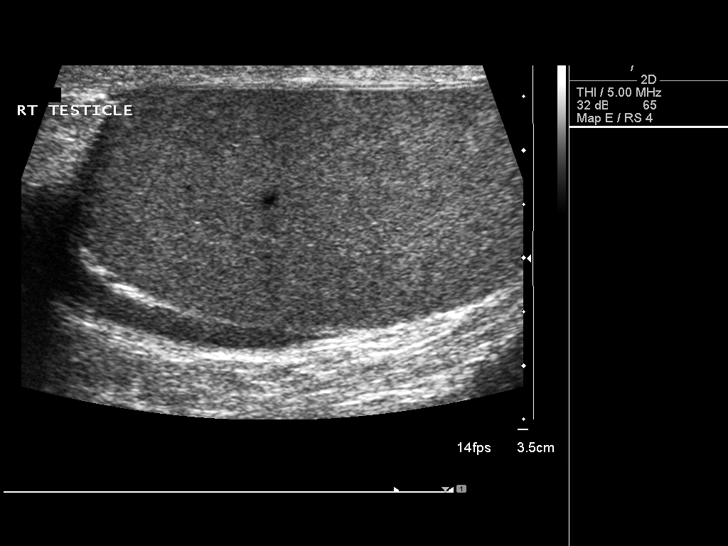
[im 23/67]
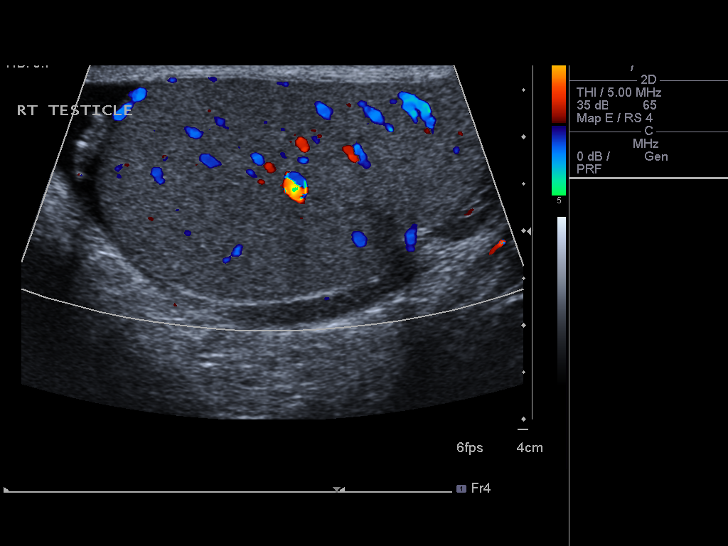
[im 25/67]
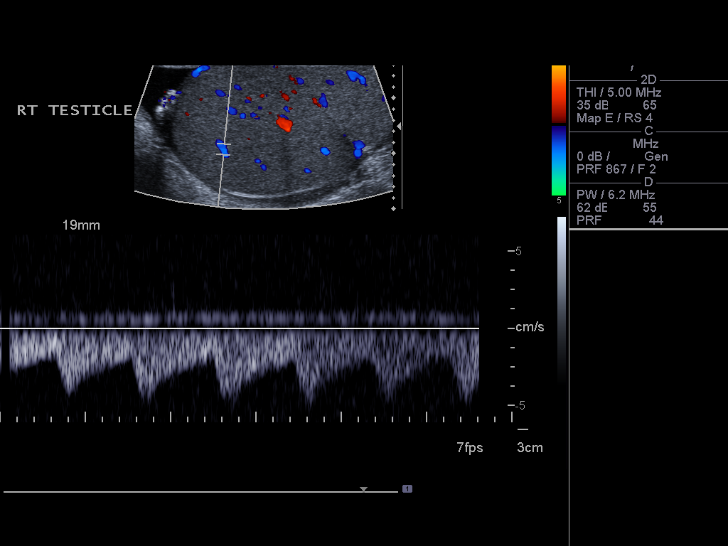
[im 31/67]
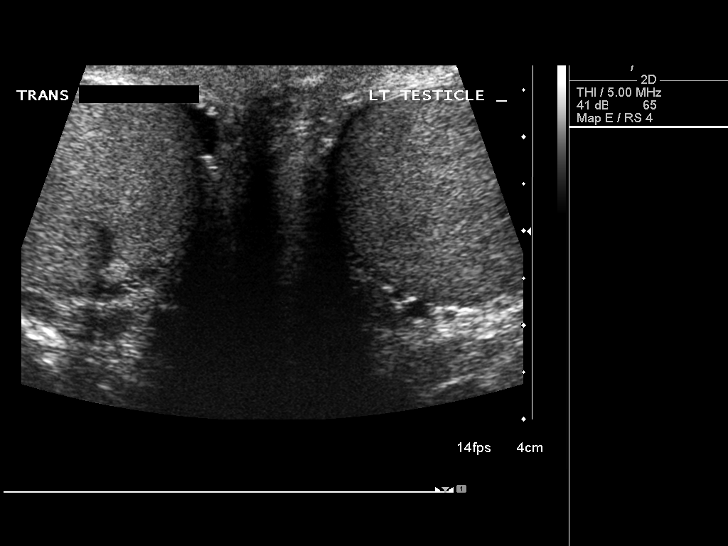
[im 36/67]
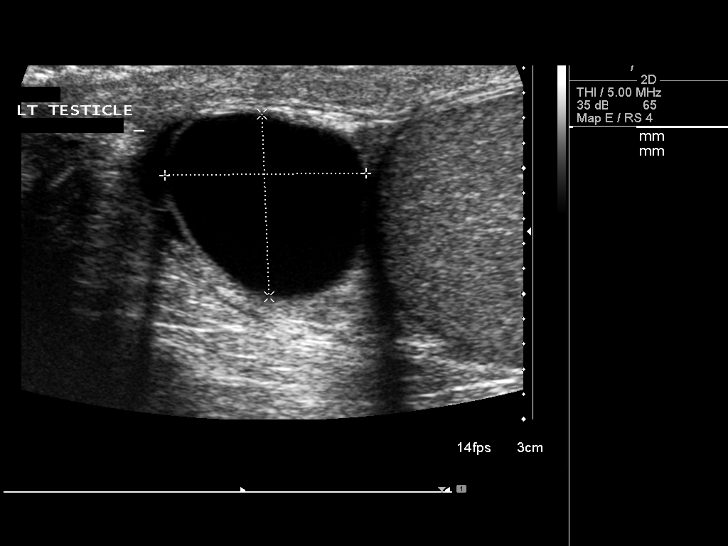
[im 42/67]
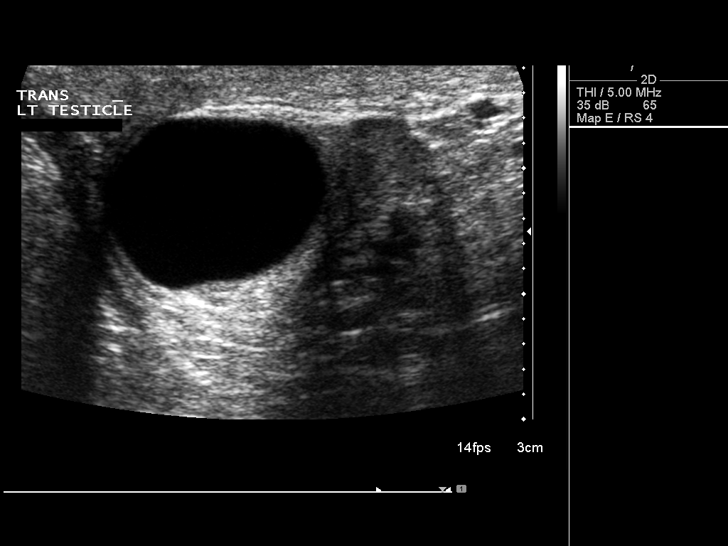
[im 45/67]
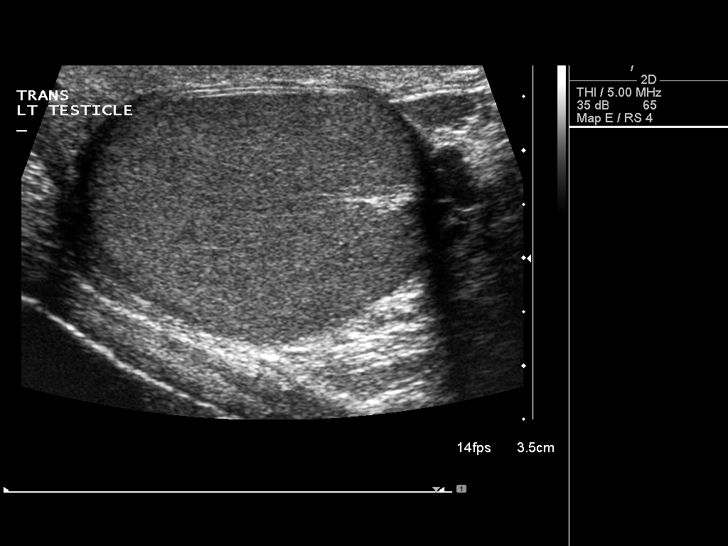
[im 50/67]
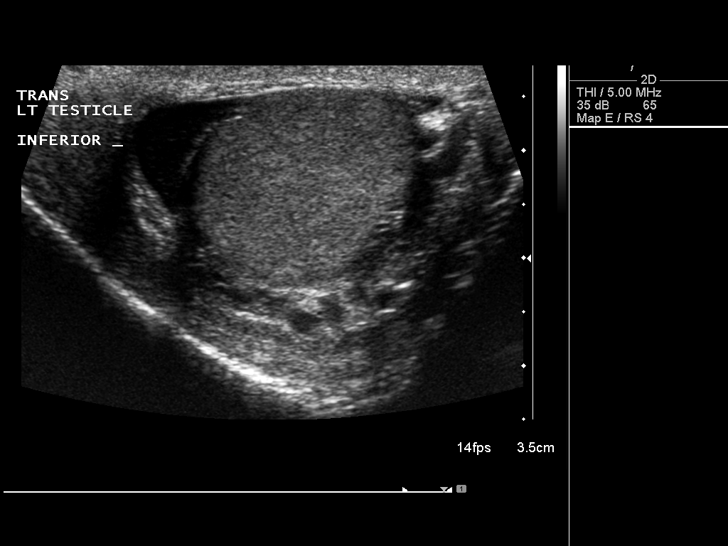
[im 56/67]
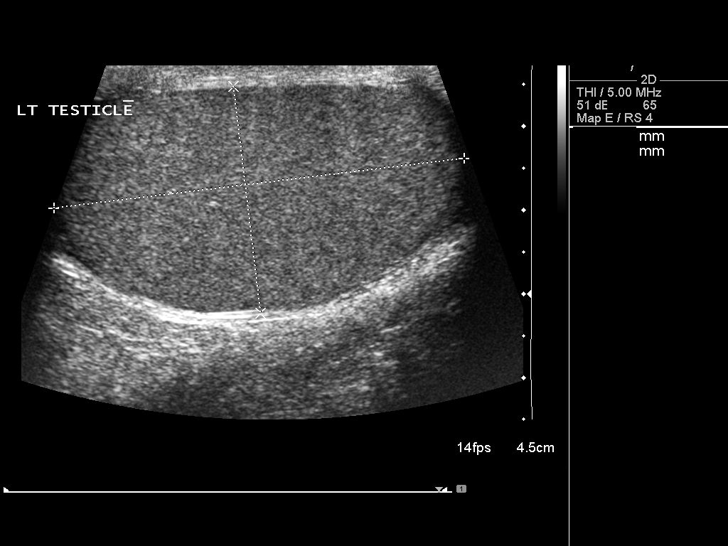
[im 61/67]
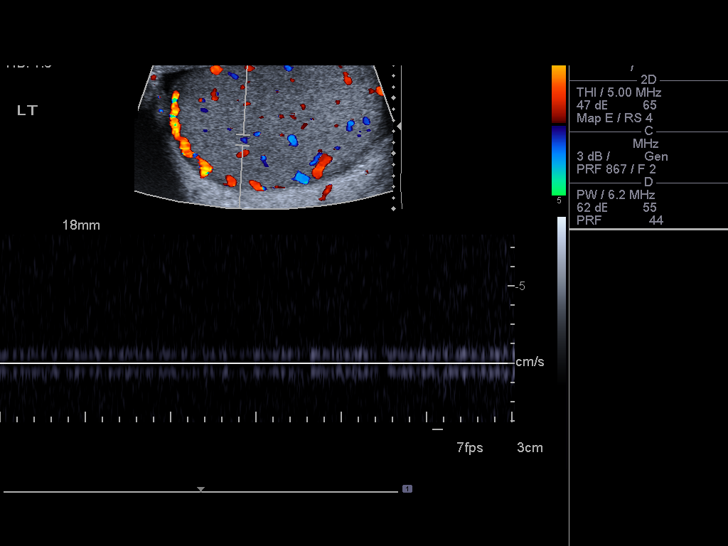
[im 67/67]
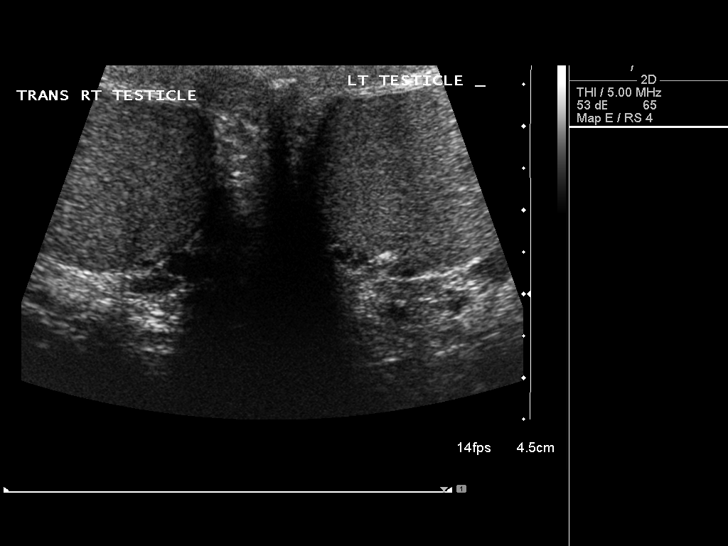

[14 of 25 positions shown; findings below may reference images not displayed]

FINDINGS: Right testis:  Normal.  4.5 x 2.4 x 3.6 cm.

Left testis:  Normal.  4.9 x 2.7 x 3.2 cm.

Right epididymis:  Two tiny cysts in the epididymal head, the
largest being 4 mm in diameter.

Left epididymis:  1.6 cm simple cyst in the epididymal head.

Hydrocele:  Small bilateral hydroceles.

Varicocele:  Absent

Pulsed Doppler interrogation of both testes demonstrates low
resistance flow bilaterally.
IMPRESSION: Normal appearing testicles with normal perfusion bilaterally.
Epididymal cysts bilaterally, with a 1.6 cm cyst in the left
epididymal head.

Small bilateral hydroceles.

## 2014-07-02 ENCOUNTER — Encounter: Payer: Self-pay | Admitting: Family Medicine

## 2014-07-02 ENCOUNTER — Ambulatory Visit (INDEPENDENT_AMBULATORY_CARE_PROVIDER_SITE_OTHER): Payer: Self-pay | Admitting: Family Medicine

## 2014-07-02 VITALS — BP 126/80 | HR 90 | Temp 98.4°F | Wt 375.0 lb

## 2014-07-02 DIAGNOSIS — E1165 Type 2 diabetes mellitus with hyperglycemia: Secondary | ICD-10-CM

## 2014-07-02 DIAGNOSIS — E119 Type 2 diabetes mellitus without complications: Secondary | ICD-10-CM

## 2014-07-02 DIAGNOSIS — H9201 Otalgia, right ear: Secondary | ICD-10-CM

## 2014-07-02 LAB — BASIC METABOLIC PANEL
BUN: 14 mg/dL (ref 6–23)
CHLORIDE: 99 meq/L (ref 96–112)
CO2: 29 meq/L (ref 19–32)
CREATININE: 1.05 mg/dL (ref 0.40–1.50)
Calcium: 9.2 mg/dL (ref 8.4–10.5)
GFR: 82.9 mL/min (ref >60.00)
Glucose, Bld: 456 mg/dL — ABNORMAL HIGH (ref 70–99)
Potassium: 4.1 mEq/L (ref 3.5–5.1)
SODIUM: 133 meq/L — AB (ref 135–145)

## 2014-07-02 LAB — MICROALBUMIN / CREATININE URINE RATIO
CREATININE, U: 64.2 mg/dL
MICROALB UR: 0.7 mg/dL (ref 0.0–1.9)
MICROALB/CREAT RATIO: 1.1 mg/g (ref 0.0–30.0)

## 2014-07-02 LAB — HEMOGLOBIN A1C: HEMOGLOBIN A1C: 12.6 % — AB (ref 4.6–6.5)

## 2014-07-02 MED ORDER — CANAGLIFLOZIN 100 MG PO TABS: 100.0000 mg | ORAL_TABLET | Freq: Every day | ORAL | Status: DC

## 2014-07-02 MED ORDER — ONETOUCH DELICA LANCETS FINE MISC: Status: AC

## 2014-07-02 MED ORDER — METFORMIN HCL 1000 MG PO TABS: 1000.0000 mg | ORAL_TABLET | Freq: Two times a day (BID) | ORAL | Status: DC

## 2014-07-02 MED ORDER — GLUCOSE BLOOD VI STRP: ORAL_STRIP | Status: AC

## 2014-07-02 MED ORDER — LIRAGLUTIDE 18 MG/3ML ~~LOC~~ SOPN: 1.2000 mg | PEN_INJECTOR | Freq: Every day | SUBCUTANEOUS | Status: DC

## 2014-07-02 NOTE — Progress Notes (Signed)
   Subjective:    Patient ID: Maxwell Roberts, male    DOB: 12/27/1975, 39 y.o.   MRN: [id number]  HPI Patient's her for the following issues  Acute issue of one week history of right earache. Symptoms are slightly better today compared to a few days ago. Had some nasal congestion. Possibly some muffled hearing on the right side. No vertigo. No ear drainage. Had some fever initially but none now.  Type 2 diabetes. History of poor control. History of poor follow-up. Last A1c 8.2%. He currently takes 3 drug regimen of metformin, Invokana, and Victoza.  Ran out of some of his diabetes medications about 2 weeks ago  Past Medical History  Diagnosis Date  . Diabetes mellitus    Past Surgical History  Procedure Laterality Date  . Lumbar disc surgery  2002    L4-5  . Uvulopalatopharyngoplasty  2003    reports that he has never smoked. He has never used smokeless tobacco. He reports that he does not drink alcohol or use illicit drugs. family history includes Heart disease (age of onset: 340) in his father. Allergies  Allergen Reactions  . Amoxicillin     REACTION: history childhood      Review of Systems  Constitutional: Negative for fatigue.  Eyes: Negative for visual disturbance.  Respiratory: Negative for cough, chest tightness and shortness of breath.   Cardiovascular: Negative for chest pain, palpitations and leg swelling.  Neurological: Negative for dizziness, syncope, weakness, light-headedness and headaches.       Objective:   Physical Exam  Constitutional: He appears well-developed and well-nourished.  HENT:  Left Ear: External ear normal.  Mouth/Throat: Oropharynx is clear and moist.  Left eardrum appears normal. Right eardrum reveals some minimal erythematous streaks on the malleus but no clear effusion. No bulging.  Neck: Neck supple.  Cardiovascular: Normal rate and regular rhythm.   Pulmonary/Chest: Effort normal and breath sounds normal. No respiratory distress. He  has no wheezes. He has no rales.          Assessment & Plan:  #1 right earache. Only very minimal erythema. No evidence for supperative changes  We've recommended observation. #2 type 2 diabetes poorly controlled. Recheck labs today with urine microalbumin screen, A1c, basic metabolic panel. Refill medications for one year. Determine follow-up interval depending on A1c result. If still poorly controlled consider further titration of Invokana

## 2014-07-02 NOTE — Progress Notes (Signed)
Pre visit review using our clinic review tool, if applicable. No additional management support is needed unless otherwise documented below in the visit note. 

## 2014-07-05 ENCOUNTER — Telehealth: Payer: Self-pay

## 2014-07-05 ENCOUNTER — Other Ambulatory Visit: Payer: Self-pay

## 2014-07-05 MED ORDER — AZITHROMYCIN 250 MG PO TABS: ORAL_TABLET | ORAL | Status: DC

## 2014-07-05 MED ORDER — CANAGLIFLOZIN 300 MG PO TABS: 300.0000 mg | ORAL_TABLET | Freq: Every day | ORAL | Status: DC

## 2014-07-05 NOTE — Telephone Encounter (Signed)
Pt states that his ear still is giving him trouble.

## 2014-07-05 NOTE — Telephone Encounter (Signed)
Rx sent to pharmacy. Pt is aware.  

## 2014-07-05 NOTE — Telephone Encounter (Signed)
Allergic to PCN.  Start Zithromax (Z-pack)

## 2014-11-04 ENCOUNTER — Telehealth: Payer: Self-pay | Admitting: *Deleted

## 2014-11-04 DIAGNOSIS — E119 Type 2 diabetes mellitus without complications: Secondary | ICD-10-CM

## 2014-11-04 NOTE — Telephone Encounter (Signed)
Left message on machine for patient to schedule a lab appointment a1c ordered Diabetic bundle 

## 2015-01-05 ENCOUNTER — Encounter: Payer: Self-pay | Admitting: Family Medicine

## 2015-01-05 ENCOUNTER — Ambulatory Visit (INDEPENDENT_AMBULATORY_CARE_PROVIDER_SITE_OTHER): Payer: 59 | Admitting: Family Medicine

## 2015-01-05 VITALS — BP 122/90 | HR 88 | Temp 97.5°F | Wt 374.0 lb

## 2015-01-05 DIAGNOSIS — Z23 Encounter for immunization: Secondary | ICD-10-CM

## 2015-01-05 DIAGNOSIS — E119 Type 2 diabetes mellitus without complications: Secondary | ICD-10-CM | POA: Diagnosis not present

## 2015-01-05 DIAGNOSIS — E1165 Type 2 diabetes mellitus with hyperglycemia: Secondary | ICD-10-CM

## 2015-01-05 DIAGNOSIS — Z8249 Family history of ischemic heart disease and other diseases of the circulatory system: Secondary | ICD-10-CM | POA: Diagnosis not present

## 2015-01-05 LAB — HM DIABETES EYE EXAM

## 2015-01-05 LAB — HM DIABETES FOOT EXAM

## 2015-01-05 MED ORDER — LIRAGLUTIDE 18 MG/3ML ~~LOC~~ SOPN: 1.2000 mg | PEN_INJECTOR | Freq: Every day | SUBCUTANEOUS | Status: AC

## 2015-01-05 NOTE — Patient Instructions (Signed)
We will call you with Stress test appointment and lab results.

## 2015-01-05 NOTE — Progress Notes (Signed)
   Subjective:    Patient ID: Maxwell Roberts, male    DOB: Jan 30, 1976, 39 y.o.   MRN: [id number]  HPI Patient has history of morbid obesity, type 2 diabetes, venous insufficiency, and obstructive sleep apnea. Seen today for follow-up  Type 2 diabetes. History of poor control. History of poor compliance. Last A1c 12.6%. We titrated up his Invokana and he also remains on metformin and Victoza.  Unfortunately, even with this regimen he has not lost any weight. He is considering bariatric surgery options. He is developing some numbness in both feet. He lost his glucose meter 2 months ago and has not been monitoring.  Positive family history CAD. Father had MI at 43. Patient denies any recent chest pains. He is considering gastric bypass surgery. He's never had any Stress testing. No dyspnea with exertion.  History of low HDL but lipids otherwise have been good in the past. He has not had these checked in almost 2 years.  Past Medical History  Diagnosis Date  . Diabetes mellitus    Past Surgical History  Procedure Laterality Date  . Lumbar disc surgery  2002    L4-5  . Uvulopalatopharyngoplasty  2003    reports that he has never smoked. He has never used smokeless tobacco. He reports that he does not drink alcohol or use illicit drugs. family history includes Heart disease (age of onset: 81) in his father. Allergies  Allergen Reactions  . Amoxicillin     REACTION: history childhood      Review of Systems  Constitutional: Negative for fatigue.  Eyes: Negative for visual disturbance.  Respiratory: Negative for cough, chest tightness and shortness of breath.   Cardiovascular: Negative for chest pain, palpitations and leg swelling.  Gastrointestinal: Negative for abdominal pain.  Endocrine: Negative for polydipsia and polyuria.  Genitourinary: Negative for dysuria.  Neurological: Negative for dizziness, syncope, weakness, light-headedness and headaches.       Objective:   Physical  Exam  Constitutional: He is oriented to person, place, and time. He appears well-developed and well-nourished.  HENT:  Right Ear: External ear normal.  Left Ear: External ear normal.  Mouth/Throat: Oropharynx is clear and moist.  Eyes: Pupils are equal, round, and reactive to light.  Neck: Neck supple. No thyromegaly present.  Cardiovascular: Normal rate and regular rhythm.   Pulmonary/Chest: Effort normal and breath sounds normal. No respiratory distress. He has no wheezes. He has no rales.  Musculoskeletal: He exhibits no edema.  Neurological: He is alert and oriented to person, place, and time.  Impairment with monofilament testing in both feet  Skin:  Small dry blood blister distal aspect right great toe. No ulceration          Assessment & Plan:  #1 type 2 diabetes. History of very poor control and poor compliance. Recheck A1c. Consider addition of long-acting insulin if still elevated. New glucose meter given. Repeat lipid panel #2 strong family history premature CAD. Father had MI age 59. Repeat EKG. Consider plain old exercise tolerance test. If this is normal, we will encourage him to start graded increased aerobic exercise program  EKG sinus rhythm with no acute findings. Pursue exercise tolerance test as above.

## 2015-01-05 NOTE — Progress Notes (Signed)
Pre visit review using our clinic review tool, if applicable. No additional management support is needed unless otherwise documented below in the visit note. 

## 2015-01-06 ENCOUNTER — Other Ambulatory Visit: Payer: 59

## 2015-01-06 ENCOUNTER — Other Ambulatory Visit: Payer: Self-pay

## 2015-01-06 LAB — LIPID PANEL
CHOLESTEROL: 112 mg/dL (ref 0–200)
HDL: 36.8 mg/dL — ABNORMAL LOW (ref >39.00)
LDL CALC: 57 mg/dL (ref 0–99)
NonHDL: 74.93
TRIGLYCERIDES: 88 mg/dL (ref 0.0–149.0)
Total CHOL/HDL Ratio: 3
VLDL: 17.6 mg/dL (ref 0.0–40.0)

## 2015-01-06 LAB — HEMOGLOBIN A1C: HEMOGLOBIN A1C: 9.9 % — AB (ref 4.6–6.5)

## 2015-01-06 MED ORDER — INSULIN GLARGINE 100 UNIT/ML SOLOSTAR PEN: PEN_INJECTOR | SUBCUTANEOUS | Status: AC

## 2015-01-08 ENCOUNTER — Other Ambulatory Visit: Payer: Self-pay | Admitting: Family Medicine

## 2015-01-27 ENCOUNTER — Telehealth (HOSPITAL_COMMUNITY): Payer: Self-pay

## 2015-01-27 NOTE — Telephone Encounter (Signed)
Encounter complete. 

## 2015-02-01 ENCOUNTER — Ambulatory Visit (HOSPITAL_COMMUNITY)
Admission: RE | Admit: 2015-02-01 | Discharge: 2015-02-01 | Disposition: A | Discharge status: Home / Self Care | Payer: 59 | Source: Ambulatory Visit | Attending: Cardiovascular Disease | Admitting: Cardiovascular Disease

## 2015-02-01 DIAGNOSIS — E1165 Type 2 diabetes mellitus with hyperglycemia: Secondary | ICD-10-CM | POA: Insufficient documentation

## 2015-02-01 DIAGNOSIS — Z8249 Family history of ischemic heart disease and other diseases of the circulatory system: Secondary | ICD-10-CM | POA: Insufficient documentation

## 2015-02-01 LAB — EXERCISE TOLERANCE TEST
CHL CUP RESTING HR STRESS: 105 {beats}/min
CHL CUP STRESS STAGE 1 GRADE: 0 %
CHL CUP STRESS STAGE 1 HR: 104 {beats}/min
CHL CUP STRESS STAGE 1 SBP: 109 mmHg
CHL CUP STRESS STAGE 2 GRADE: 0 %
CHL CUP STRESS STAGE 2 SPEED: 1 mph
CHL CUP STRESS STAGE 4 GRADE: 10 %
CHL CUP STRESS STAGE 4 HR: 137 {beats}/min
CHL CUP STRESS STAGE 4 SPEED: 1.7 mph
CHL CUP STRESS STAGE 5 DBP: 90 mmHg
CHL CUP STRESS STAGE 5 GRADE: 12 %
CHL CUP STRESS STAGE 5 HR: 160 {beats}/min
CHL CUP STRESS STAGE 5 SBP: 133 mmHg
CHL CUP STRESS STAGE 5 SPEED: 2.5 mph
CHL CUP STRESS STAGE 6 GRADE: 12.1 %
CHL CUP STRESS STAGE 6 HR: 160 {beats}/min
CHL CUP STRESS STAGE 6 SPEED: 2.4 mph
CHL CUP STRESS STAGE 7 DBP: 92 mmHg
CHL CUP STRESS STAGE 8 GRADE: 0 %
CSEPED: 6 min
CSEPEW: 7 METS
CSEPHR: 89 %
CSEPPHR: 160 {beats}/min
MPHR: 179 {beats}/min
Percent of predicted max HR: 89 %
RPE: 17
Stage 1 DBP: 89 mmHg
Stage 1 Speed: 0 mph
Stage 2 HR: 96 {beats}/min
Stage 3 Grade: 0 %
Stage 3 HR: 96 {beats}/min
Stage 3 Speed: 1 mph
Stage 4 DBP: 90 mmHg
Stage 4 SBP: 135 mmHg
Stage 7 Grade: 0 %
Stage 7 HR: 150 {beats}/min
Stage 7 SBP: 119 mmHg
Stage 7 Speed: 0 mph
Stage 8 DBP: 85 mmHg
Stage 8 HR: 111 {beats}/min
Stage 8 SBP: 129 mmHg
Stage 8 Speed: 0 mph

## 2015-02-03 ENCOUNTER — Telehealth: Payer: Self-pay | Admitting: Family Medicine

## 2015-02-03 NOTE — Telephone Encounter (Signed)
Maxwell Roberts would like to talk to you about getting gastric bypass. Also the Rx that you prescribe him (Glucose) is to expensive he wants to know if you can change it for something else

## 2015-05-26 LAB — HM DIABETES EYE EXAM

## 2015-06-06 ENCOUNTER — Encounter: Payer: Self-pay | Admitting: Family Medicine

## 2015-10-28 ENCOUNTER — Other Ambulatory Visit: Payer: Self-pay | Admitting: Family Medicine

## 2021-03-08 NOTE — Progress Notes (Signed)
Formatting of this note is different from the original.  Virtual Visit    Subjective:    Patient ID: Mitchell Gould is a 45 y.o. male.    HPI   Respiratory  Patient presents with: cough    Chronicity:  New  Onset:  2 weeks  Frequency:  Constant  Timing:  In the morning and in the afternoon  Severity:  Moderate  Exposure to:  Sick contacts  Cough characteristics:  Dry  Treatments tried:  OTC medications and rest  Response to treatment:  Mild improvement  Associated symptoms: chest tightness, headaches, fatigue, myalgias, congestion, postnasal drip, sore throat, sinus pain and cough    Associated symptoms: no decreased appetite, no chest pain, no ear pain, no fever, no malaise, no nocturnal dyspnea, no shortness of breath, no sweats, no unplanned weight loss, no rash, no urticaria, no abdominal pain, no itching, no drooling, no trouble swallowing, no vomiting, no syncope, no sneezing, no swollen glands, no rhinorrhea and no wheezing    Recent travel:  None  History of asthma: No    History of COPD: No      Review of Systems   Constitutional: Positive for fatigue. Negative for decreased appetite and fever.   HENT: Positive for congestion, postnasal drip, sinus pressure, sinus pain and sore throat. Negative for drooling, ear discharge, ear pain, facial swelling, hearing loss, mouth sores, nosebleeds, rhinorrhea, sneezing, tinnitus, trouble swallowing and voice change.    Respiratory: Positive for cough and chest tightness. Negative for apnea, choking, shortness of breath, wheezing and stridor.    Cardiovascular: Negative.  Negative for chest pain, palpitations, leg swelling and syncope.   Gastrointestinal: Negative.  Negative for abdominal pain and vomiting.   Musculoskeletal: Positive for myalgias.   Skin: Negative for itching and rash.   Neurological: Positive for headaches. Negative for light-headedness.   All other systems reviewed and are negative.    Social History     Tobacco Use   Smoking Status Never    Smokeless Tobacco Never     Past Medical History:   Diagnosis Date   ? Depression    ? Diabetes mellitus    ? Obesity      No past surgical history on file.  No family history on file.  Objective:   Physical Exam  Constitutional:       Appearance: Normal appearance.   HENT:      Right Ear: No tenderness.      Left Ear: No tenderness.      Ears:      Comments: Provider directed patient to palpate external ears. Pt denies pain with self manipulation of pinna and tragus bilaterally     Nose: No congestion or rhinorrhea.      Right Sinus: Maxillary sinus tenderness present. No frontal sinus tenderness.      Left Sinus: Maxillary sinus tenderness present. No frontal sinus tenderness.      Comments: Patient demonstrated self-guided assessment appropriately.Upon self- palpation of sinus cavities and neck, patient denies any complications except tenderness to sinuses. Denies pain upon flexing of neck. Denies any complications and none noted with appropriate with inhalation and exhalation      Mouth/Throat:      Lips: Pink.      Mouth: Mucous membranes are moist.      Pharynx: Oropharynx is clear. Uvula midline. No oropharyngeal exudate or posterior oropharyngeal erythema.      Tonsils: No tonsillar exudate.      Comments: Patient demonstrated  self-guided assessment appropriately. Able to visualize throat adequately.   Eyes:      Comments: Eyes observed on camera. Sclera white. No lumps, erythema, lesions, or drainage. Pupils are equal.    Pulmonary:      Effort: Pulmonary effort is normal.      Comments: Noted unlabored breathing/respiratory effort normal. Pt able to inspire/expire without coughing, or respiratory distress. No auditory wheezing heard. Patient is able to talk in complete sentences.    Unable to physically assess lung sounds due to visit being conducted virtually.    Lymphadenopathy:      Cervical: No cervical adenopathy.      Comments: Patient demonstrated self-guided assessment appropriately. Able to  correctly palpate lymph nodes. No tenderness or enlargement noted to lymph nodes with palpating.   Neurological:      Mental Status: He is alert.   Psychiatric:         Mood and Affect: Mood normal.         Assessment/Plan:      Clinical presentation significant for viral infection.  Self-care and symptomatic treatment recommended in accordance with [facility] guidelines.  Based on today's history, physical exam no testing indicated,  patient's visit diagnosis is/includes   1. Acute non-recurrent pansinusitis      Patient has history of chronic conditions not reviewed today.    Treatment plan includes:   Orders Placed:  No orders of the defined types were placed in this encounter.    Medications ordered this visit     Signed Prescriptions Disp Refills   ? sodium chloride (OCEAN) 0.65 % nasal spray 15 mL 12     Sig: Instill 1 spray into each nostril as needed for congestion   ? fluticasone propionate (FLONASE) 50 mcg/actuation nasal spray 16 g 0     Sig: Instill 1 spray into each nostril 2 (two) times a day for 28 days   ? guaiFENesin 600 mg Ta12       Sig: Take 600-1,200 mg by mouth 2 (two) times a day as needed (cough) for up to 7 days Max daily dose: 2400mg .   ? doxycycline (VIBRA-TABS) 100 MG tablet 14 tablet 0     Sig: Take 1 tablet (100 mg total) by mouth 2 (two) times a day for 7 days Not for pregnancy/lactation   ? albuterol (VENTOLIN HFA) 90 mcg/actuation inhaler 1 each 0     Sig: Inhale 1-2 puffs Every 4-6 hours as needed for wheezing for up to 30 days   ? benzonatate (TESSALON) 100 MG capsule 30 capsule 0     Sig: Swallow whole one (100mg ) capsule by mouth 3 times a day  as needed.Do not break, chew, dissolve, cut or crush.     Provider Recommendations     Follow up care instructions were provided to the Patient. patient verbalized understanding of plan of care today.    1. Acute non-recurrent pansinusitis    - sodium chloride (OCEAN) 0.65 % nasal spray; Instill 1 spray into each nostril as needed for  congestion  Dispense: 15 mL; Refill: 12  - fluticasone propionate (FLONASE) 50 mcg/actuation nasal spray; Instill 1 spray into each nostril 2 (two) times a day for 28 days  Dispense: 16 g; Refill: 0  - guaiFENesin 600 mg Ta12; Take 600-1,200 mg by mouth 2 (two) times a day as needed (cough) for up to 7 days Max daily dose: 2400mg .  - doxycycline (VIBRA-TABS) 100 MG tablet; Take 1 tablet (100 mg total) by mouth  2 (two) times a day for 7 days Not for pregnancy/lactation  Dispense: 14 tablet; Refill: 0  - albuterol (VENTOLIN HFA) 90 mcg/actuation inhaler; Inhale 1-2 puffs Every 4-6 hours as needed for wheezing for up to 30 days  Dispense: 1 each; Refill: 0  - benzonatate (TESSALON) 100 MG capsule; Swallow whole one (100mg ) capsule by mouth 3 times a day  as needed.Do not break, chew, dissolve, cut or crush.  Dispense: 30 capsule; Refill: 0    Visit was performed virtually  Electronically signed by , NP at 03/08/2021  1:22 PM EST

## 2022-01-17 ENCOUNTER — Inpatient Hospital Stay
Admit: 2022-01-17 | Discharge: 2022-01-17 | Disposition: A | Discharge status: Home / Self Care | Payer: BLUE CROSS/BLUE SHIELD

## 2022-01-17 ENCOUNTER — Emergency Department: Admit: 2022-01-17 | Payer: BLUE CROSS/BLUE SHIELD | Primary: Medical

## 2022-01-17 DIAGNOSIS — R0789 Other chest pain: Secondary | ICD-10-CM

## 2022-01-17 LAB — EKG 12-LEAD
P Axis: 52 degrees
P-R Interval: 148 ms
Q-T Interval: 411 ms
QRS Duration: 110 ms
QTc Calculation (Bazett): 417 ms
R Axis: 14 degrees
T Axis: 12 degrees
Ventricular Rate: 61 {beats}/min

## 2022-01-17 LAB — ADD ON LAB TEST

## 2022-01-17 LAB — TROPONIN: Troponin T: <0.01 ng/mL (ref 0.000–0.010)

## 2022-01-17 LAB — COMPREHENSIVE METABOLIC PANEL
ALT: 56 U/L — ABNORMAL HIGH (ref 0–50)
AST: 37 U/L (ref 0–50)
Albumin/Globulin Ratio: 2 (ref 1.00–2.70)
Albumin: 4.6 g/dL (ref 3.5–5.2)
Alk Phosphatase: 70 U/L (ref 40–130)
Anion Gap: 11 mmol/L (ref 2–17)
BUN: 12 mg/dL (ref 6–20)
CO2: 29 mmol/L (ref 22–29)
Calcium: 9.2 mg/dL (ref 8.6–10.0)
Chloride: 95 mmol/L — ABNORMAL LOW (ref 98–107)
Creatinine: 1 mg/dL (ref 0.7–1.3)
Est, Glom Filt Rate: 93 mL/min/1.73m?? (ref >=60)
Globulin: 3 g/dL (ref 1.9–4.4)
Glucose: 364 mg/dL — ABNORMAL HIGH (ref 70–99)
Osmolaliy Calculated: 284 mOsm/kg (ref 270–287)
Potassium: 4.6 mmol/L (ref 3.5–5.3)
Sodium: 135 mmol/L (ref 135–145)
Total Bilirubin: 0.36 mg/dL (ref 0.00–1.20)
Total Protein: 7.5 g/dL (ref 6.4–8.3)

## 2022-01-17 LAB — CBC
Hematocrit: 48.1 % (ref 38.0–52.0)
Hemoglobin: 15.8 g/dL (ref 13.0–17.3)
MCH: 28.8 pg (ref 27.0–34.5)
MCHC: 32.8 g/dL (ref 32.0–36.0)
MCV: 87.8 fL (ref 84.0–100.0)
MPV: 10.1 fL (ref 7.2–13.2)
Platelets: 254 10*3/uL (ref 140–440)
RBC: 5.48 x10e6/mcL (ref 4.00–5.60)
RDW: 12.2 % (ref 11.0–16.0)
WBC: 9.2 10*3/uL (ref 3.8–10.6)

## 2022-01-17 LAB — MAGNESIUM: Magnesium: 2.2 mg/dL (ref 1.6–2.6)

## 2022-01-17 LAB — COVID-19 & INFLUENZA COMBO (LIAT HOSPITAL)
Influenza A: NOT DETECTED
Influenza B: NOT DETECTED
SARS-CoV-2: NOT DETECTED

## 2022-01-17 MED ORDER — ACETAMINOPHEN 325 MG PO TABS
325 MG | Freq: Once | ORAL | Status: AC
Start: 2022-01-17 — End: 2022-01-17
  Administered 2022-01-17: 22:00:00 650 mg via ORAL

## 2022-01-17 MED ORDER — METFORMIN HCL 500 MG PO TABS
500 MG | ORAL_TABLET | Freq: Every day | ORAL | 0 refills | Status: AC
Start: 2022-01-17 — End: ?

## 2022-01-17 MED ORDER — METFORMIN HCL 1000 MG PO TABS
1000 MG | ORAL_TABLET | Freq: Every day | ORAL | 0 refills | Status: DC
Start: 2022-01-17 — End: 2022-01-17

## 2022-01-17 MED ORDER — METFORMIN HCL 500 MG PO TABS
500 MG | Freq: Once | ORAL | Status: AC
Start: 2022-01-17 — End: 2022-01-17
  Administered 2022-01-17: 22:00:00 500 mg via ORAL

## 2022-01-17 MED FILL — ACETAMINOPHEN 325 MG PO TABS: 325 MG | ORAL | Qty: 2

## 2022-01-17 MED FILL — METFORMIN HCL 500 MG PO TABS: 500 MG | ORAL | Qty: 1

## 2022-01-17 NOTE — ED Provider Notes (Signed)
[facility] DEPT  EMERGENCY DEPARTMENT ENCOUNTER      Pt Name: Mitchell Gould  MRN: [id number]  [place] November 19, 1975  Date of evaluation: 01/17/2022  Provider: Baldo Daub, PA-C    CHIEF COMPLAINT       Chief Complaint   Patient presents with    Chest Pain     C/o CP and SOB x 24 hrs, denies previous cards hx. Hx of DM w/ neuropathy.          HISTORY OF PRESENT ILLNESS      Mitchell Gould is a 46 y.o. male who presents to the emergency department continuation due to left chest pain with stated shortness of breath at home over the last 24 hours.  Patient admits to intermittent history of these symptoms.  He denies taking any medication for symptom control at home.  Patient denies any tobacco use, previous MI, CVA or PE.  Patient states that the symptoms are constant, not worsened with position, exertion.  Patient denies any leg swelling.  He does state that his symptoms are accompanied by a cough.  There is been no fever, chills, diaphoresis, nausea, vomiting.  No known sick contacts.  Patient has a history of diabetes mellitus, he has been out of his metformin and noncompliant with its use.      Nursing Notes were reviewed.    REVIEW OF SYSTEMS       Review of Systems   Constitutional:  Negative for chills and fever.   HENT:  Negative for congestion and sore throat.    Eyes:  Negative for photophobia and visual disturbance.   Respiratory:  Negative for chest tightness and shortness of breath.    Cardiovascular:  Positive for chest pain. Negative for palpitations.   Gastrointestinal:  Negative for abdominal pain and diarrhea.   Genitourinary:  Negative for dysuria and hematuria.   Musculoskeletal:  Negative for arthralgias and myalgias.   Skin:  Negative for rash and wound.   Neurological:  Negative for numbness and headaches.   Psychiatric/Behavioral:  Negative for confusion and hallucinations.    All other systems reviewed and are negative.      Except as noted above the remainder of the review of systems was  reviewed and negative.       PAST MEDICAL HISTORY   No past medical history on file.      SURGICAL HISTORY     No past surgical history on file.      CURRENT MEDICATIONS       There are no discharge medications for this patient.      ALLERGIES     Iodinated contrast media and Mango flavor    FAMILY HISTORY     No family history on file.       SOCIAL HISTORY       Social History     Socioeconomic History    Marital status: Married       SCREENINGS         Glasgow Coma Scale  Eye Opening: Spontaneous  Best Verbal Response: Oriented  Best Motor Response: Obeys commands  Glasgow Coma Scale Score: 15     Heart Score for chest pain patients  History: Slightly Suspicious  ECG: Normal  Patient Age: > 29 and < 65 years  *Risk factors for Atherosclerotic disease: Obesity, Diabetes Mellitus  Risk Factors: 1 or 2 risk factors  Troponin: < 1X normal limit  Heart Score Total: 2  CIWA Assessment  BP: 123/66  Pulse: 56                 PHYSICAL EXAM         ED Triage Vitals   BP Temp Temp src Pulse Respirations SpO2 Height Weight - Scale   01/17/22 1607 01/17/22 1609 -- 01/17/22 1609 01/17/22 1607 01/17/22 1607 01/17/22 1607 01/17/22 1607   (!) 143/85 98.2 F (36.8 C)  62 16 98 % '6\' 1"'  (1.854 m) (!) 335 lb (152 kg)       Physical Exam  Vitals and nursing note reviewed.   Constitutional:       General: He is not in acute distress.     Appearance: Normal appearance. He is not ill-appearing, toxic-appearing or diaphoretic.   HENT:      Head: Normocephalic and atraumatic.      Nose: Nose normal.      Mouth/Throat:      Mouth: Mucous membranes are moist.   Eyes:      Extraocular Movements: Extraocular movements intact.      Conjunctiva/sclera: Conjunctivae normal.   Cardiovascular:      Rate and Rhythm: Normal rate and regular rhythm.      Heart sounds: Normal heart sounds. No murmur heard.  Pulmonary:      Effort: Pulmonary effort is normal. No respiratory distress.      Breath sounds: Normal breath sounds. No stridor. No  wheezing, rhonchi or rales.   Chest:      Chest wall: No tenderness.   Abdominal:      General: There is no distension.      Palpations: Abdomen is soft.      Tenderness: There is no abdominal tenderness.   Musculoskeletal:         General: No deformity. Normal range of motion.      Cervical back: Normal range of motion and neck supple.   Skin:     General: Skin is warm.      Coloration: Skin is not jaundiced.   Neurological:      General: No focal deficit present.      Mental Status: He is alert. Mental status is at baseline.   Psychiatric:         Mood and Affect: Mood normal.         Behavior: Behavior normal.           PROCEDURES:  Unless otherwise noted below, none     Procedures    DIAGNOSTIC RESULTS     EKG: All EKG's are interpreted by the Emergency Department Physician who either signs or Co-signs this chart in the absence of a cardiologist.      RADIOLOGY:   Non-plain film images such as CT, Ultrasound and MRI are read by the radiologist. Plain radiographic images are visualized and preliminarily interpreted by the emergency physician with the below findings:    Interpretation per the Radiologist below, if available at the time of this note:    XR CHEST (2 VW)   Final Result   No focal infiltrate or overt pulmonary edema.          LABS:  Labs Reviewed   COMPREHENSIVE METABOLIC PANEL - Abnormal; Notable for the following components:       Result Value    Chloride 95 (*)     Glucose 364 (*)     ALT 56 (*)     All other components within normal limits   COVID-19 &  INFLUENZA COMBO [facility])    Narrative:     Is this test for diagnosis or screening?->Diagnosis of ill patient  Symptomatic for COVID-19 as defined by CDC?->Unknown  Date of Symptom Onset->N/A  Hospitalized for COVID-19?->No  Admitted to ICU for COVID-19?->No  Pregnant:->No   CBC   TROPONIN   ADD ON LAB TEST   MAGNESIUM       All other labs were within normal range or not returned as of this dictation.    EMERGENCY DEPARTMENT COURSE and  DIFFERENTIAL DIAGNOSIS/MDM:   Vitals:    Vitals:    01/17/22 1607 01/17/22 1609 01/17/22 1740   BP: (!) 143/85  123/66   Pulse:  62 56   Resp: 16  15   Temp:  98.2 F (36.8 C)    SpO2: 98%  97%   Weight: (!) 152 kg     Height: '6\' 1"'  (1.854 m)         Medical Decision Making  46 year old male presents ER today for evaluation due to stated chest pain and shortness of breath over the last 24 hours.  Physical exam shows a patient in no acute distress, non-ill-appearing, nontoxic.,  With stable vital signs.  Patient nontachycardic, afebrile, with 98% SPO2 on room air.  Patient is negative on PERC rule out.  We will perform a full cardiac work-up although suspicion for ACS.    Amount and/or Complexity of Data Reviewed  External Data Reviewed: notes.  Labs: ordered. Decision-making details documented in ED Course.  Radiology: ordered and independent interpretation performed. Decision-making details documented in ED Course.  ECG/medicine tests: ordered and independent interpretation performed. Decision-making details documented in ED Course.    Risk  OTC drugs.  Prescription drug management.        REASSESSMENT     ED Course as of 01/17/22 1829   Wed Jan 17, 2022   1636 EKG 12 Lead  EKG shows normal sinus rhythm at a rate of 61 bpm.  No ST elevations or ST depressions.  Nonspecific T wave abnormalities. [RL]   1729 Magnesium: 2.2 [RL]   1729 Troponin T: <0.010 [RL]   1729 Glucose, Random(!): 364 [RL]   1729 Comprehensive Metabolic Panel(!):    Sodium 135   Potassium 4.6   Chloride 95(!)   CO2 29   Glucose, Random 364(!)   BUN,BUNPL 12   Creatinine 1.0   Anion Gap 11   OSMOLALITY CALCULATED 284   CALCIUM, SERUM, 500694 9.2   Total Protein 7.5   Albumin 4.6   Globulin 3.0   ALBUMIN/GLOBULIN RATIO 2.00   BILIRUBIN TOTAL 0.36   Alk Phos 70   AST 37   ALT 56(!)   Est, Glom Filt Rate 93 [RL]   1730 Thorough discussion with the patient regards to results today.  Heart score of 2.  The plan for the patient to be discharged home  with close follow-up with primary care in regards to his chest pain today.  He was found to have hyperglycemia at 364 leading to discussion in regards to his noncompliance with metformin at home.  He is given metformin in the ER and given prescription to be restarted on his previous DM type II patient will follow-up outpatient with primary care.  He will return to the ER if symptoms change or worsen. [RL]   1731 Patient's work-up today is benign based on his physical exam and HPI.  Heart score 2.  Negative troponin.  Negative chest x-ray.  Patient diagnosed  with atypical chest pain.  Plan for him to follow-up with primary care for further evaluation and treatment COVID-19 and influenza swab pending at this time.  Return to the ER if symptoms change or worsen. [RL]      ED Course User Index  [RL] Baldo Daub, PA-C               FINAL IMPRESSION      1. Atypical chest pain          DISPOSITION/PLAN   DISPOSITION Decision To Discharge 01/17/2022 05:31:20 PM      PATIENT REFERRED TO:  No follow-up provider specified.    DISCHARGE MEDICATIONS:  There are no discharge medications for this patient.        (Please note that portions of this note were completed with a voice recognition program.  Efforts were made to edit the dictations but occasionally words are mis-transcribed.)    Baldo Daub, PA-C (electronically signed)  Attending Emergency Physician           Baldo Daub, PA-C  01/17/22 1829

## 2022-01-17 NOTE — Discharge Instructions (Signed)
You may use Tylenol or ibuprofen at home for pain improvement.  Please check your COVID-19 and influenza result at home.  Follow-up with primary care over the next 3 to 5 days for secondary evaluation and further work-up.  Return the ER if symptoms change or worsen.

## 2022-10-10 ENCOUNTER — Ambulatory Visit: Admit: 2022-10-10 | Discharge: 2022-10-10 | Payer: BLUE CROSS/BLUE SHIELD | Attending: Family Medicine | Primary: Family

## 2022-10-10 ENCOUNTER — Ambulatory Visit: Admit: 2022-10-10 | Discharge: 2022-10-10 | Payer: BLUE CROSS/BLUE SHIELD | Primary: Family

## 2022-10-10 DIAGNOSIS — S8012XA Contusion of left lower leg, initial encounter: Secondary | ICD-10-CM

## 2022-10-10 DIAGNOSIS — S90422A Blister (nonthermal), left great toe, initial encounter: Secondary | ICD-10-CM

## 2022-10-10 MED ORDER — DOXYCYCLINE HYCLATE 100 MG PO TABS
100 | ORAL_TABLET | Freq: Two times a day (BID) | ORAL | 0 refills | Status: AC
Start: 2022-10-10 — End: 2022-10-20

## 2022-10-10 MED ORDER — TETANUS-DIPHTH-ACELL PERTUSSIS 5-2.5-18.5 LF-MCG/0.5 IM SUSP
Freq: Once | INTRAMUSCULAR | Status: AC
Start: 2022-10-10 — End: 2022-10-10
  Administered 2022-10-10: 18:00:00 0.5 mL via INTRAMUSCULAR

## 2022-10-10 NOTE — Telephone Encounter (Signed)
Patient called and said he will try his medications first before scheduling an appointment to be seen

## 2022-10-10 NOTE — Patient Instructions (Signed)
Discussed with patient my concerns for his 2 different wounds.  Wound on the anterior tibial surface appears to be slowly healing.  Some granulation tissue present.  Tetanus shot updated today; placed patient on doxycycline for 10 days, to cover for salt water organisms.    Wound over the first MTP joint concerning for tunneling type infection.  X-rays performed; Independent review of imaging of L toe does not reveal any obvious osteomyelitis or fracture.  Radiologist final report was available at time of discharge, and correlates with independent read.     Referral placed for wound care, because I am not certain how quickly the doxycycline will start to work, and I am concerned patient may have acute worsening of his symptoms over the next few days.  ER precautions given if the patient starts running a fever or having systemic symptoms.    You will receive a follow up survey after your visit, usually by e-mail, or by text link.  Please take the time to tell us what you thought of your visit - what we did well...any staff that made your day! Or ways that we can improve!   We also welcome [employer] reviews!  Thanks for helping [place] be the best [facility] possible!

## 2022-10-10 NOTE — Progress Notes (Signed)
Chief Complaint  Chief Complaint   Patient presents with    Abrasion     R leg x 1 week fell trying to get off a boat in [place]         Subjective     HPI:  Mitchell Gould with (DOB:  24-Jun-1975) is a 47 y.o. male, here for evaluation of the following chief complaint(s):  Abrasion (R leg x 1 week fell trying to get off a boat in [place] )    Patient states that he went on a cruise last week, to [place].  He went in the water to go snorkeling, and when he was getting back on the boat, his foot slipped and he struck his left shin on the wooden steps.  It bled a lot, he had to have it bandaged.  Got back from the cruise few days ago.  He noticed over the weekend, he had a bump or blister that started up over his big toe on the left foot.  It has popped and now he has a white crater in the skin.  Past medical history significant for type 2 diabetes "not always well-controlled", per patient.  He does not know when his last tetanus shot was.  He states he has had problems in the past with healing of wounds, especially when his sugars are up.      History provided by:  Patient  Language interpreter used: No         Allergies   Allergen Reactions    Iodinated Contrast Media Hives    Mango Flavor Hives     No past medical history on file.  No past surgical history on file.  Current Medications:  Current Outpatient Medications   Medication Sig Dispense Refill    omeprazole (PRILOSEC) 40 MG delayed release capsule TAKE 1 CAPSULE BY MOUTH ONCE DAILY 30 MINUTES BEFORE MORNING MEAL      doxycycline hyclate (VIBRA-TABS) 100 MG tablet Take 1 tablet by mouth 2 times daily for 10 days 20 tablet 0    metFORMIN (GLUCOPHAGE) 500 MG tablet Take 1 tablet by mouth daily (with breakfast) 30 tablet 0     No current facility-administered medications for this visit.        Review of System:  Review of Systems    All other systems negative other than noted in HPI         Objective     BP 127/85   Pulse 73   Temp 97.9 F (36.6 C)   Resp 20   Wt  (!) 148.8 kg (328 lb)   SpO2 97%   BMI 43.27 kg/m     Physical Exam:  Physical Exam  Vitals and nursing note reviewed.   Constitutional:       Appearance: Normal appearance.      Comments: Morbidly obese.   HENT:      Head: Normocephalic and atraumatic.   Pulmonary:      Effort: Pulmonary effort is normal.   Musculoskeletal:         General: Swelling, tenderness and deformity present.   Skin:     General: Skin is warm and dry.      Findings: Erythema and lesion present.      Comments: Hypopigmented crater this lesion present over the dorsum of the left foot, over first MTP joint.  No discharge.  Appears to have macerated tissue at the surface.  Patient with irregular shaped abrasion to the left anterior tibial surface.  Background setting of venous stasis color changes.  Please see photos.   Neurological:      General: No focal deficit present.      Mental Status: He is alert.      Cranial Nerves: No cranial nerve deficit.      Sensory: No sensory deficit.                     Results for orders placed or performed in visit on 10/10/22   XR TOE LEFT (MIN 2 VIEWS)    Narrative    XR TOE LEFT (MIN 2 VIEWS) 10/10/22    INDICATION: ?blister over 1st MTP joint, now hole with macerated tissue, x 4-5   days. Poorly controlled DM, concern for osteo    COMPARISON: None    TECHNIQUE: 3 view    FINDINGS: Trace degenerative disease is seen of the first metatarsal phalangeal   joint with minimal medial joint space narrowing and osteoarthritic spurring. No   fractures evident. No destructive process or periosteal new bone formation is   evident. No foreign body.      Impression    No acute bony abnormality.         Assessment & Plan   ASSESSMENT/PLAN:    ICD-10-CM    1. Contusion of left lower extremity, initial encounter  S80.12XA [facility] [place]      2. Recent travel on cruise ship  Z78.9       3. Non-healing wound of lower extremity, left, initial encounter  S81.802A  Tetanus-Diphth-Acell Pertussis (BOOSTRIX) injection 0.5 mL     doxycycline hyclate (VIBRA-TABS) 100 MG tablet     [facility] [place]      4. Blister of left great toe, initial encounter  S90.422A XR TOE LEFT (MIN 2 VIEWS)     [facility] [place]      5. Uncontrolled type 2 diabetes mellitus with hyperglycemia (HCC)  E11.65       6. Venous stasis dermatitis  I87.2         1. Contusion of left lower extremity, initial encounter  -     [facility] [place]  2. Recent travel on cruise ship  3. Non-healing wound of lower extremity, left, initial encounter  -     Tetanus-Diphth-Acell Pertussis (BOOSTRIX) injection 0.5 mL; 0.5 mL, IntraMUSCular, ONCE, 1 dose, On Wed 10/10/22 at 1430  -     doxycycline hyclate (VIBRA-TABS) 100 MG tablet; Take 1 tablet by mouth 2 times daily for 10 days, Disp-20 tablet, R-0Normal  -     [facility] [place]  4. Blister of left great toe, initial encounter  -     XR TOE LEFT (MIN 2 VIEWS); Future  -     [facility] [place]  5. Uncontrolled type 2 diabetes mellitus with hyperglycemia (HCC)  6. Venous stasis dermatitis    Allergies reviewed with patient for accuracy, and any errors were corrected.   No results found for this visit on 10/10/22.    Immunizations Administered       Name Date Dose Route    TDaP, ADACEL (age 30y-64y), Leda Min (age 10y+), IM, 0.57mL 10/10/2022 0.5 mL Intramuscular    Site: Deltoid- Right    Lot: [id number]  NDC: 16109-604-54          Patient Instructions   Discussed with patient my concerns for his 2 different wounds.  Wound on the anterior tibial surface appears to be slowly healing.  Some granulation tissue present.  Tetanus shot updated today; placed patient on doxycycline for 10 days, to cover for salt water organisms.    Wound over the first  MTP joint concerning for tunneling type infection.  X-rays performed; Independent review of imaging of L toe does not reveal any obvious osteomyelitis or fracture.  Radiologist final report was available at time of discharge, and correlates with independent read.     Referral placed for wound care, because I am not certain how quickly the doxycycline will start to work, and I am concerned patient may have acute worsening of his symptoms over the next few days.  ER precautions given if the patient starts running a fever or having systemic symptoms.    You will receive a follow up survey after your visit, usually by e-mail, or by text link.  Please take the time to tell us what you thought of your visit - what we did well...any staff that made your day! Or ways that we can improve!   We also welcome [employer] reviews!  Thanks for helping [place] be the best [facility] possible!       Return in about 3 days (around 10/13/2022) for Re-evaluation by specialist office.       An electronic signature was used to authenticate this note.    --Verlee Rossetti, MD
